# Patient Record
Sex: Male | Born: 1990 | Race: White | Hispanic: No | Marital: Married | Smoking: Never smoker
Health system: Southern US, Community
[De-identification: ages and names within clinical notes are randomized; demographics above are authoritative.]

## PROBLEM LIST (undated history)

## (undated) DIAGNOSIS — Z8709 Personal history of other diseases of the respiratory system: Secondary | ICD-10-CM

## (undated) HISTORY — PX: TONSILLECTOMY: SUR1361

## (undated) HISTORY — DX: Personal history of other diseases of the respiratory system: Z87.09

---

## 2018-03-23 ENCOUNTER — Other Ambulatory Visit: Payer: Self-pay

## 2018-03-23 ENCOUNTER — Emergency Department (HOSPITAL_COMMUNITY)
Admission: EM | Admit: 2018-03-23 | Discharge: 2018-03-24 | Disposition: A | Discharge status: Home / Self Care | Payer: 59 | Attending: Emergency Medicine | Admitting: Emergency Medicine

## 2018-03-23 ENCOUNTER — Encounter (HOSPITAL_COMMUNITY): Payer: Self-pay | Admitting: Emergency Medicine

## 2018-03-23 DIAGNOSIS — R1011 Right upper quadrant pain: Secondary | ICD-10-CM | POA: Insufficient documentation

## 2018-03-23 LAB — CBC
HCT: 45.3 % (ref 39.0–52.0)
HEMOGLOBIN: 15.6 g/dL (ref 13.0–17.0)
MCH: 30.9 pg (ref 26.0–34.0)
MCHC: 34.4 g/dL (ref 30.0–36.0)
MCV: 89.7 fL (ref 78.0–100.0)
Platelets: 264 10*3/uL (ref 150–400)
RBC: 5.05 MIL/uL (ref 4.22–5.81)
RDW: 13.1 % (ref 11.5–15.5)
WBC: 9.2 10*3/uL (ref 4.0–10.5)

## 2018-03-23 NOTE — ED Triage Notes (Signed)
Patient presents with LUQ abdominal pain x1 week. Patient endorses nausea and dry heaves. BMs normal. Nothing makes the pain better or worse.

## 2018-03-24 ENCOUNTER — Emergency Department (HOSPITAL_COMMUNITY): Payer: 59

## 2018-03-24 LAB — COMPREHENSIVE METABOLIC PANEL
ALBUMIN: 4.1 g/dL (ref 3.5–5.0)
ALT: 54 U/L — ABNORMAL HIGH (ref 0–44)
AST: 40 U/L (ref 15–41)
Alkaline Phosphatase: 48 U/L (ref 38–126)
Anion gap: 9 (ref 5–15)
BUN: 13 mg/dL (ref 6–20)
CHLORIDE: 107 mmol/L (ref 98–111)
CO2: 24 mmol/L (ref 22–32)
Calcium: 9.1 mg/dL (ref 8.9–10.3)
Creatinine, Ser: 0.92 mg/dL (ref 0.61–1.24)
GFR calc Af Amer: >60 mL/min (ref >60)
Glucose, Bld: 97 mg/dL (ref 70–99)
POTASSIUM: 4.6 mmol/L (ref 3.5–5.1)
Sodium: 140 mmol/L (ref 135–145)
Total Bilirubin: 1.1 mg/dL (ref 0.3–1.2)
Total Protein: 7.3 g/dL (ref 6.5–8.1)

## 2018-03-24 LAB — LIPASE, BLOOD: LIPASE: 30 U/L (ref 11–51)

## 2018-03-24 MED ORDER — OMEPRAZOLE 20 MG PO CPDR: 20.0000 mg | DELAYED_RELEASE_CAPSULE | Freq: Every day | ORAL | 0 refills | Status: DC

## 2018-03-24 MED ORDER — IOPAMIDOL (ISOVUE-300) INJECTION 61%
INTRAVENOUS | Status: AC
  Filled 2018-03-24: qty 100

## 2018-03-24 MED ORDER — ONDANSETRON HCL 4 MG/2ML IJ SOLN
4.0000 mg | Freq: Once | INTRAMUSCULAR | Status: AC
Start: 2018-03-24 — End: 2018-03-24
  Administered 2018-03-24: 4 mg via INTRAVENOUS
  Filled 2018-03-24: qty 2

## 2018-03-24 MED ORDER — IOPAMIDOL (ISOVUE-300) INJECTION 61%
100.0000 mL | Freq: Once | INTRAVENOUS | Status: AC | PRN
  Administered 2018-03-24: 100 mL via INTRAVENOUS

## 2018-03-24 MED ORDER — MORPHINE SULFATE (PF) 4 MG/ML IV SOLN
4.0000 mg | Freq: Once | INTRAVENOUS | Status: AC
  Administered 2018-03-24: 4 mg via INTRAVENOUS
  Filled 2018-03-24: qty 1

## 2018-03-24 MED ORDER — SUCRALFATE 1 G PO TABS: 1.0000 g | ORAL_TABLET | Freq: Three times a day (TID) | ORAL | 0 refills | Status: DC

## 2018-03-24 NOTE — ED Provider Notes (Signed)
[facility]-EMERGENCY DEPT Provider Note   CSN: [id number] Arrival date & time: 03/23/18  2001     History   Chief Complaint Chief Complaint  Patient presents with  . Abdominal Pain    HPI Derek Fernandez is a 27 y.o. male.  Patient presents to the emergency department with a chief complaint of right upper quadrant pain.  He states that he has been having the symptoms for the past week or so.  He reports associated postprandial pain.  He reports some vomiting and some diarrhea.  He denies any fevers or chills.  He states that he occasionally drinks alcohol, but not regularly.  He has tried taking Tylenol and ibuprofen with no relief.  He states that his pain comes in waves.  He denies any other associated symptoms.  The history is provided by the patient. No language interpreter was used.    History reviewed. No pertinent past medical history.  There are no active problems to display for this patient.   History reviewed. No pertinent surgical history.      Home Medications    Prior to Admission medications   Medication Sig Start Date End Date Taking? Authorizing Provider  acetaminophen (TYLENOL) 500 MG tablet Take 500 mg by mouth every 6 (six) hours as needed for moderate pain.   Yes [provider]    Family History No family history on file.  Social History Social History   Tobacco Use  . Smoking status: Never Smoker  . Smokeless tobacco: Current User    Types: Snuff  Substance Use Topics  . Alcohol use: Yes    Comment: Occassional  . Drug use: Never     Allergies   Patient has no known allergies.   Review of Systems Review of Systems  All other systems reviewed and are negative.    Physical Exam Updated Vital Signs BP (!) 128/98 (BP Location: Right Arm)   Pulse 83   Temp 98.4 F (36.9 C) (Oral)   Resp 20   SpO2 99%   Physical Exam  Constitutional: He is oriented to person, place, and time. He appears  well-developed and well-nourished.  HENT:  Head: Normocephalic and atraumatic.  Eyes: Pupils are equal, round, and reactive to light. Conjunctivae and EOM are normal. Right eye exhibits no discharge. Left eye exhibits no discharge. No scleral icterus.  Neck: Normal range of motion. Neck supple. No JVD present.  Cardiovascular: Normal rate, regular rhythm and normal heart sounds. Exam reveals no gallop and no friction rub.  No murmur heard. Pulmonary/Chest: Effort normal and breath sounds normal. No respiratory distress. He has no wheezes. He has no rales. He exhibits no tenderness.  Abdominal: Soft. He exhibits no distension and no mass. There is tenderness in the right upper quadrant. There is no rebound and no guarding.  Musculoskeletal: Normal range of motion. He exhibits no edema or tenderness.  Neurological: He is alert and oriented to person, place, and time.  Skin: Skin is warm and dry.  Psychiatric: He has a normal mood and affect. His behavior is normal. Judgment and thought content normal.  Nursing note and vitals reviewed.    ED Treatments / Results  Labs (all labs ordered are listed, but only abnormal results are displayed) Labs Reviewed  CBC  LIPASE, BLOOD  COMPREHENSIVE METABOLIC PANEL    EKG None  Radiology No results found.  Procedures Procedures (including critical care time)  Medications Ordered in ED Medications - No data to display  Initial Impression / Assessment and Plan / ED Course  I have reviewed the triage vital signs and the nursing notes.  Pertinent labs & imaging results that were available during my care of the patient were reviewed by me and considered in my medical decision making (see chart for details).     Patient with right upper quadrant pain x1 week.  Some tenderness on exam.  Vital signs are stable.  Patient is afebrile.  There is concern for gallbladder disease, will check right upper quadrant ultrasound.  1:35 AM Ultrasound is  negative, however on repeat exam, the patient is no longer complaining of right upper quadrant pain, but states that the pain is now shifted to his right lower quadrant.  CT shows no acute process.  In the setting of normal imaging, reassuring labs, and reassuring vitals, will peptic ulcer with Carafate and omeprazole.  Recommend GI follow-up if no improvement in the next week or so.  Patient understands and agrees with the plan.  He is stable and ready for discharge.  Final Clinical Impressions(s) / ED Diagnoses   Final diagnoses:  Right upper quadrant abdominal pain    ED Discharge Orders         Ordered    sucralfate (CARAFATE) 1 g tablet  3 times daily with meals & bedtime     03/24/18 0317    omeprazole (PRILOSEC) 20 MG capsule  Daily     03/24/18 0317           Roxy Horseman, PA-C 03/24/18 0318    Nira Conn, MD 03/24/18 1739

## 2019-02-06 ENCOUNTER — Ambulatory Visit (HOSPITAL_COMMUNITY)
Admission: EM | Admit: 2019-02-06 | Discharge: 2019-02-06 | Disposition: A | Discharge status: Home / Self Care | Payer: 59 | Attending: Family Medicine | Admitting: Family Medicine

## 2019-02-06 ENCOUNTER — Encounter (HOSPITAL_COMMUNITY): Payer: Self-pay

## 2019-02-06 ENCOUNTER — Other Ambulatory Visit: Payer: Self-pay

## 2019-02-06 DIAGNOSIS — R51 Headache: Secondary | ICD-10-CM

## 2019-02-06 DIAGNOSIS — R202 Paresthesia of skin: Secondary | ICD-10-CM | POA: Diagnosis not present

## 2019-02-06 DIAGNOSIS — R232 Flushing: Secondary | ICD-10-CM | POA: Diagnosis not present

## 2019-02-06 DIAGNOSIS — R519 Headache, unspecified: Secondary | ICD-10-CM

## 2019-02-06 NOTE — Discharge Instructions (Signed)
Your symptoms today have improved or resolved.  If your symptoms return, go to the ER.  Establish primary care doctor and be seen for follow up in 1-2 weeks.

## 2019-02-06 NOTE — ED Triage Notes (Signed)
Pt states he was at work ant started to feel some chest pain and pain his left arm. Pt states he still has a headache. Pt states a burning type of pain.

## 2019-02-06 NOTE — ED Provider Notes (Signed)
[facility]    CSN: [id number] Arrival date & time: 02/06/19  1209     History   Chief Complaint Chief Complaint  Patient presents with  . Chest Pain    HPI Derek Fernandez is a 28 y.o. male. He reports an episode this morning of numbness and flushing in his face which progressed to his chest and left arm. This episode lasted a few minutes and he reports he has a mild residual headache at this time. He took 650 mg aspirin which he felt improved his symptoms. He denies other pain, SOB, nausea, vomiting, weakness, diaphoresis with this episode or currently. He denies family hx of cardiac disease. He denies use of illicit drugs. He states he has had previous episodes of this type one year ago and several years ago. He does not have a primary care provider.   The history is provided by the patient.    History reviewed. No pertinent past medical history.  There are no active problems to display for this patient.   History reviewed. No pertinent surgical history.     Home Medications    Prior to Admission medications   Medication Sig Start Date End Date Taking? Authorizing Provider  acetaminophen (TYLENOL) 500 MG tablet Take 500 mg by mouth every 6 (six) hours as needed for moderate pain.    [provider]  omeprazole (PRILOSEC) 20 MG capsule Take 1 capsule (20 mg total) by mouth daily. 03/24/18   Montine Circle, PA-C  sucralfate (CARAFATE) 1 g tablet Take 1 tablet (1 g total) by mouth 4 (four) times daily -  with meals and at bedtime. 03/24/18   Montine Circle, PA-C    Family History History reviewed. No pertinent family history.  Social History Social History   Tobacco Use  . Smoking status: Never Smoker  . Smokeless tobacco: Current User    Types: Snuff  Substance Use Topics  . Alcohol use: Yes    Comment: Occassional  . Drug use: Never     Allergies   Patient has no known allergies.   Review of Systems Review of Systems   Constitutional: Negative for chills, fatigue and fever.  HENT: Negative for congestion, ear pain, sinus pressure, sore throat, tinnitus and trouble swallowing.   Eyes: Negative for pain and visual disturbance.  Respiratory: Negative for cough and shortness of breath.   Cardiovascular: Negative for chest pain and palpitations.  Gastrointestinal: Negative for abdominal pain and vomiting.  Genitourinary: Negative for dysuria and hematuria.  Musculoskeletal: Negative for arthralgias and back pain.  Skin: Negative for color change and rash.  Neurological: Negative for seizures and syncope.  All other systems reviewed and are negative.    Physical Exam Triage Vital Signs ED Triage Vitals  Enc Vitals Group     BP 02/06/19 1224 134/81     Pulse Rate 02/06/19 1224 78     Resp 02/06/19 1224 18     Temp 02/06/19 1224 98.7 F (37.1 C)     Temp Source 02/06/19 1224 Oral     SpO2 02/06/19 1224 100 %     Weight 02/06/19 1226 295 lb (133.8 kg)     Height --      Head Circumference --      Peak Flow --      Pain Score 02/06/19 1225 4     Pain Loc --      Pain Edu? --      Excl. in [place]? --  No data found.  Updated Vital Signs BP 134/81 (BP Location: Right Arm)   Pulse 78   Temp 98.7 F (37.1 C) (Oral)   Resp 18   Wt 295 lb (133.8 kg)   SpO2 100%   Visual Acuity Right Eye Distance:   Left Eye Distance:   Bilateral Distance:    Right Eye Near:   Left Eye Near:    Bilateral Near:     Physical Exam Vitals signs and nursing note reviewed.  Constitutional:      Appearance: He is well-developed.  HENT:     Head: Normocephalic and atraumatic.  Eyes:     Conjunctiva/sclera: Conjunctivae normal.  Neck:     Musculoskeletal: Neck supple.  Cardiovascular:     Rate and Rhythm: Normal rate and regular rhythm.     Heart sounds: No murmur.  Pulmonary:     Effort: Pulmonary effort is normal. No respiratory distress.     Breath sounds: Normal breath sounds.  Abdominal:      Palpations: Abdomen is soft.     Tenderness: There is no abdominal tenderness.  Skin:    General: Skin is warm and dry.  Neurological:     Mental Status: He is alert and oriented to person, place, and time.     Cranial Nerves: No cranial nerve deficit or facial asymmetry.     Sensory: No sensory deficit.     Motor: No weakness.     Gait: Gait normal.     Deep Tendon Reflexes: Reflexes normal.      UC Treatments / Results  Labs (all labs ordered are listed, but only abnormal results are displayed) Labs Reviewed - No data to display  EKG None  Radiology No results found.  Procedures Procedures (including critical care time)  Medications Ordered in UC Medications - No data to display  Initial Impression / Assessment and Plan / UC Course  I have reviewed the triage vital signs and the nursing notes.  Pertinent labs & imaging results that were available during my care of the patient were reviewed by me and considered in my medical decision making (see chart for details).   Headache, nonintractable. EKG performed; normal sinus rhythm without ST elevation, rate 78. Neuro exam normal. Symptoms have improved or resolved at this time.  Discussed with patient that he is be seen in the ED if his symptoms return.  Also discussed that he needs to establish a primary care provider.  Case discussed with Dr. Tracie HarrierHagler and he agrees with treatment plan.   Final Clinical Impressions(s) / UC Diagnoses   Final diagnoses:  Acute nonintractable headache, unspecified headache type     Discharge Instructions     Your symptoms today have improved or resolved.  If your symptoms return, go to the ER.  Establish primary care doctor and be seen for follow up in 1-2 weeks.      ED Prescriptions    None     Controlled Substance Prescriptions  Controlled Substance Registry consulted? Not Applicable   Mickie Bailate, Noah Lembke H, NP 02/06/19 1420

## 2019-02-07 ENCOUNTER — Other Ambulatory Visit: Payer: Self-pay

## 2019-02-07 ENCOUNTER — Emergency Department (HOSPITAL_COMMUNITY)
Admission: EM | Admit: 2019-02-07 | Discharge: 2019-02-08 | Disposition: A | Discharge status: Home / Self Care | Payer: 59 | Attending: Emergency Medicine | Admitting: Emergency Medicine

## 2019-02-07 ENCOUNTER — Encounter (HOSPITAL_COMMUNITY): Payer: Self-pay | Admitting: Emergency Medicine

## 2019-02-07 DIAGNOSIS — R51 Headache: Secondary | ICD-10-CM | POA: Diagnosis not present

## 2019-02-07 DIAGNOSIS — R040 Epistaxis: Secondary | ICD-10-CM | POA: Diagnosis present

## 2019-02-07 DIAGNOSIS — F17228 Nicotine dependence, chewing tobacco, with other nicotine-induced disorders: Secondary | ICD-10-CM | POA: Diagnosis not present

## 2019-02-07 DIAGNOSIS — R0981 Nasal congestion: Secondary | ICD-10-CM | POA: Insufficient documentation

## 2019-02-07 DIAGNOSIS — R0789 Other chest pain: Secondary | ICD-10-CM

## 2019-02-07 NOTE — ED Triage Notes (Signed)
Pt c/o nose bleed that lasted for an hour. Pt also has headache. Bleeding stopped at this time.

## 2019-02-08 ENCOUNTER — Emergency Department (HOSPITAL_COMMUNITY): Payer: 59

## 2019-02-08 LAB — BASIC METABOLIC PANEL
Anion gap: 10 (ref 5–15)
BUN: 14 mg/dL (ref 6–20)
CO2: 24 mmol/L (ref 22–32)
Calcium: 8.7 mg/dL — ABNORMAL LOW (ref 8.9–10.3)
Chloride: 107 mmol/L (ref 98–111)
Creatinine, Ser: 0.85 mg/dL (ref 0.61–1.24)
GFR calc Af Amer: >60 mL/min (ref >60)
GFR calc non Af Amer: >60 mL/min (ref >60)
Glucose, Bld: 110 mg/dL — ABNORMAL HIGH (ref 70–99)
Potassium: 3.6 mmol/L (ref 3.5–5.1)
Sodium: 141 mmol/L (ref 135–145)

## 2019-02-08 LAB — CBC WITH DIFFERENTIAL/PLATELET
Abs Immature Granulocytes: 0.05 10*3/uL (ref 0.00–0.07)
Basophils Absolute: 0.1 10*3/uL (ref 0.0–0.1)
Basophils Relative: 1 %
Eosinophils Absolute: 0.2 10*3/uL (ref 0.0–0.5)
Eosinophils Relative: 2 %
HCT: 42.4 % (ref 39.0–52.0)
Hemoglobin: 14 g/dL (ref 13.0–17.0)
Immature Granulocytes: 1 %
Lymphocytes Relative: 32 %
Lymphs Abs: 3 10*3/uL (ref 0.7–4.0)
MCH: 30.5 pg (ref 26.0–34.0)
MCHC: 33 g/dL (ref 30.0–36.0)
MCV: 92.4 fL (ref 80.0–100.0)
Monocytes Absolute: 1 10*3/uL (ref 0.1–1.0)
Monocytes Relative: 11 %
Neutro Abs: 5.2 10*3/uL (ref 1.7–7.7)
Neutrophils Relative %: 53 %
Platelets: 250 10*3/uL (ref 150–400)
RBC: 4.59 MIL/uL (ref 4.22–5.81)
RDW: 13.2 % (ref 11.5–15.5)
WBC: 9.6 10*3/uL (ref 4.0–10.5)
nRBC: 0 % (ref 0.0–0.2)

## 2019-02-08 LAB — TROPONIN I (HIGH SENSITIVITY)
Troponin I (High Sensitivity): 5 ng/L (ref <18)
Troponin I (High Sensitivity): 5 ng/L (ref <18)

## 2019-02-08 MED ORDER — ALUM & MAG HYDROXIDE-SIMETH 200-200-20 MG/5ML PO SUSP
30.0000 mL | Freq: Once | ORAL | Status: AC
  Administered 2019-02-08: 30 mL via ORAL
  Filled 2019-02-08: qty 30

## 2019-02-08 MED ORDER — DOXYCYCLINE HYCLATE 100 MG PO CAPS: 100.0000 mg | ORAL_CAPSULE | Freq: Two times a day (BID) | ORAL | 0 refills | Status: DC

## 2019-02-08 MED ORDER — OXYMETAZOLINE HCL 0.05 % NA SOLN
1.0000 | Freq: Once | NASAL | Status: AC
  Administered 2019-02-08: 1 via NASAL
  Filled 2019-02-08: qty 30

## 2019-02-08 NOTE — ED Provider Notes (Signed)
[facility] EMERGENCY DEPARTMENT Provider Note   CSN: [id number] Arrival date & time: 02/07/19  2032     History   Chief Complaint Chief Complaint  Patient presents with  . Epistaxis    HPI Ernest Pinendrew J Dockham is a 28 y.o. male.     Patient with no medical history.  Here with nosebleed from his left nare that resolved after about 1 hour at home.  States he has had intermittent bloody congestion for the past 1 week.  He denies cough or fever.  He was seen in urgent care 2 days ago for pain in his chest that was brief while he was driving.  It lasted for about 10 minutes, down his left arm.  He was told his EKG was normal.  He has not had any further chest pain.  He denies any shortness of breath, nausea, vomiting, diaphoresis.  He states his nosebleed stopped on its own.  He does not smoke.  He complains of pain in his face as well as a headache.  He feels that he could have a sinus infection.  Denies any cardiac history.  He does not smoke cigarettes.  No history of asthma or COPD.  No dizziness or lightheadedness.  The history is provided by the patient.  Epistaxis Associated symptoms: congestion   Associated symptoms: no dizziness, no fever and no headaches     History reviewed. No pertinent past medical history.  There are no active problems to display for this patient.   History reviewed. No pertinent surgical history.      Home Medications    Prior to Admission medications   Medication Sig Start Date End Date Taking? Authorizing Provider  acetaminophen (TYLENOL) 500 MG tablet Take 500 mg by mouth every 6 (six) hours as needed for moderate pain.    [provider]  omeprazole (PRILOSEC) 20 MG capsule Take 1 capsule (20 mg total) by mouth daily. 03/24/18   Roxy HorsemanBrowning, Robert, PA-C  sucralfate (CARAFATE) 1 g tablet Take 1 tablet (1 g total) by mouth 4 (four) times daily -  with meals and at bedtime. 03/24/18   Roxy HorsemanBrowning, Robert, PA-C    Family History History  reviewed. No pertinent family history.  Social History Social History   Tobacco Use  . Smoking status: Never Smoker  . Smokeless tobacco: Current User    Types: Snuff  Substance Use Topics  . Alcohol use: Yes    Comment: Occassional  . Drug use: Never     Allergies   Patient has no known allergies.   Review of Systems Review of Systems  Constitutional: Negative for activity change, appetite change and fever.  HENT: Positive for congestion, nosebleeds and sinus pressure. Negative for trouble swallowing.   Eyes: Negative for visual disturbance.  Respiratory: Positive for chest tightness. Negative for shortness of breath.   Cardiovascular: Positive for chest pain.  Gastrointestinal: Negative for abdominal pain, nausea and vomiting.  Genitourinary: Negative for dysuria and hematuria.  Musculoskeletal: Negative for arthralgias and myalgias.  Skin: Negative for rash.  Neurological: Negative for dizziness, weakness and headaches.    all other systems are negative except as noted in the HPI and PMH.    Physical Exam Updated Vital Signs BP (!) 151/106 (BP Location: Right Arm)   Pulse 91   Temp 98.3 F (36.8 C) (Oral)   Resp 19   Ht 6' (1.829 m)   Wt 131.5 kg   SpO2 100%   BMI 39.33 kg/m   Physical Exam  Vitals signs and nursing note reviewed.  Constitutional:      General: He is not in acute distress.    Appearance: He is well-developed. He is obese.  HENT:     Head: Normocephalic and atraumatic.     Right Ear: Tympanic membrane normal.     Left Ear: Tympanic membrane normal.     Nose: Congestion present.     Comments: No active bleeding.  There is abrasions to the septum bilaterally, no septal hematoma or hemotympanum.  Oropharynx is clear.  Bilateral frontal and maxillary sinus tenderness.    Mouth/Throat:     Pharynx: No oropharyngeal exudate.  Eyes:     Conjunctiva/sclera: Conjunctivae normal.     Pupils: Pupils are equal, round, and reactive to light.   Neck:     Musculoskeletal: Normal range of motion and neck supple.     Comments: No meningismus. Cardiovascular:     Rate and Rhythm: Normal rate and regular rhythm.     Heart sounds: Normal heart sounds. No murmur.  Pulmonary:     Effort: Pulmonary effort is normal. No respiratory distress.     Breath sounds: Normal breath sounds.  Abdominal:     Palpations: Abdomen is soft.     Tenderness: There is no abdominal tenderness. There is no guarding or rebound.  Musculoskeletal: Normal range of motion.        General: No tenderness.  Skin:    General: Skin is warm.     Capillary Refill: Capillary refill takes less than 2 seconds.  Neurological:     General: No focal deficit present.     Mental Status: He is alert and oriented to person, place, and time. Mental status is at baseline.     Cranial Nerves: No cranial nerve deficit.     Motor: No abnormal muscle tone.     Coordination: Coordination normal.     Comments:  5/5 strength throughout. CN 2-12 intact.Equal grip strength.   Psychiatric:        Behavior: Behavior normal.      ED Treatments / Results  Labs (all labs ordered are listed, but only abnormal results are displayed) Labs Reviewed  BASIC METABOLIC PANEL - Abnormal; Notable for the following components:      Result Value   Glucose, Bld 110 (*)    Calcium 8.7 (*)    All other components within normal limits  CBC WITH DIFFERENTIAL/PLATELET  TROPONIN I (HIGH SENSITIVITY)  TROPONIN I (HIGH SENSITIVITY)    EKG EKG Interpretation  Date/Time:  Friday February 08 2019 00:52:12 EDT Ventricular Rate:  73 PR Interval:    QRS Duration: 92 QT Interval:  358 QTC Calculation: 395 R Axis:   67 Text Interpretation:  Sinus rhythm Abnormal inferior Q waves Baseline wander in lead(s) II V2 V6 No significant change was found Confirmed by Glynn Octaveancour, Chasady Longwell [phone]([id number]) on 02/08/2019 1:06:08 AM   Radiology Dg Chest 2 View  Result Date: 02/08/2019 CLINICAL DATA:  Chest pain EXAM:  CHEST - 2 VIEW COMPARISON:  None. FINDINGS: The heart size and mediastinal contours are within normal limits. Both lungs are clear. The visualized skeletal structures are unremarkable. IMPRESSION: No active cardiopulmonary disease. Electronically Signed   By: Katherine Mantlehristopher  Green M.D.   On: 02/08/2019 01:33    Procedures Procedures (including critical care time)  Medications Ordered in ED Medications  oxymetazoline (AFRIN) 0.05 % nasal spray 1 spray (has no administration in time range)     Initial Impression / Assessment and Plan /  ED Course  I have reviewed the triage vital signs and the nursing notes.  Pertinent labs & imaging results that were available during my care of the patient were reviewed by me and considered in my medical decision making (see chart for details).       Nosebleed in setting of nasal congestion for the past 1 week.  Episode of chest pain yesterday which has since resolved.  EKG is unchanged.  Chest x-ray is negative.  Troponin negative.  Low suspicion for ACS or PE.  Patient treated with topical Afrin given the friability of his nasal mucosa.  We will treat for sinusitis with antibiotics. Low suspicion for ACS, PE, or dissection.  Troponin negative x2. No further episodes of chest pain.  D/w patient humidified air, topical afrin, PCP and ENT followup.  Return precautions discussed.  Final Clinical Impressions(s) / ED Diagnoses   Final diagnoses:  Epistaxis, recurrent  Atypical chest pain    ED Discharge Orders    None       Aldon Hengst, Annie Main, MD 02/08/19 [phone]

## 2019-02-08 NOTE — Discharge Instructions (Signed)
Use the Afrin spray twice daily for the next 2 days only then stop.  Take the antibiotics as prescribed for a possible sinus infection.  Follow-up with your primary doctor and the ENT doctor.  Return to the ED with new or worsening symptoms.

## 2019-02-19 ENCOUNTER — Telehealth (INDEPENDENT_AMBULATORY_CARE_PROVIDER_SITE_OTHER): Payer: 59 | Admitting: Family Medicine

## 2019-02-19 ENCOUNTER — Encounter: Payer: Self-pay | Admitting: Family Medicine

## 2019-02-19 DIAGNOSIS — R0789 Other chest pain: Secondary | ICD-10-CM

## 2019-02-19 DIAGNOSIS — R040 Epistaxis: Secondary | ICD-10-CM

## 2019-02-24 NOTE — Progress Notes (Signed)
Virtual Visit via Video Note  I connected with Derek Fernandez on 02/24/19 at  2:50 PM EDT by a video enabled telemedicine application and verified that I am speaking with the correct person using two identifiers.  Location: Patient: Located at home during today's encounter  Provider: Located at primary care office     I discussed the limitations of evaluation and management by telemedicine and the availability of in person appointments. The patient expressed understanding and agreed to proceed.  History of Present Illness: Derek Fernandez is present for today's video encounter to establish care.  Patient was recently seen at the ER with a complaint of chest pain and epistaxis. He reports chest pain resolved after ER visit, however his nose continued to bleed for another 2 to 3 days.  He attempted relief with Afrin but reports that made worse.  He has no known history of seasonal allergies.  Endorses however having some congestion and dry nasal passages.  He presently does not take any medication.  Nose discontinued bleeding on its own.  He has never been evaluated by an ENT.  This was the first time he is ever experienced any issues with nosebleeds. He is obese and inactive of routine physical activity. No known family history of heart disease. He is a non-smoker. Uncertain of last physical exam.  Assessment and Plan: 1. Epistaxis -Resolved _Requesting an ENT referral as this was new occurrence and took a few days to resolve. Suspect allergies and recommend OTC antihistamines  2. Atypical chest pain, resolved Uncertain of the etiology although suspect possible anxiety related to persistency of nosebleeding. GAD 7 negative   Follow Up Instructions: Schedule CPE after early convenience with fasting labs.   I discussed the assessment and treatment plan with the patient. The patient was provided an opportunity to ask questions and all were answered. The patient agreed with the plan and demonstrated  an understanding of the instructions.   The patient was advised to call back or seek an in-person evaluation if the symptoms worsen or if the condition fails to improve as anticipated.  I provided 25 minutes of non-face-to-face time during this encounter.   Molli Barrows, FNP

## 2019-03-13 ENCOUNTER — Telehealth: Payer: Self-pay

## 2019-03-13 NOTE — Telephone Encounter (Signed)
Called patient to do their pre-visit COVID screening.  Have you been tested for COVID or are you currently waiting for COVID test results? no  Have you recently traveled internationally([place], [place], [place], [place], [place]) or within the [place] to a hotspot area([place], [place], [place], [place], [place])? no  Are you currently experiencing any of the following: fever, cough, SHOB, fatigue, body aches, loss of smell, rash, diarrhea, vomiting, severe headaches, weakness, sore throat? no  Have you been in contact with anyone who has recently travelled? no  Have you been in contact with anyone who is experiencing any of the above symptoms or been diagnosed with [place]  or works in or has recently visited a SNF? No  Asked patient to come fasting for CPE labs.

## 2019-03-14 ENCOUNTER — Other Ambulatory Visit: Payer: Self-pay

## 2019-03-14 ENCOUNTER — Encounter: Payer: Self-pay | Admitting: Family Medicine

## 2019-03-14 ENCOUNTER — Ambulatory Visit (INDEPENDENT_AMBULATORY_CARE_PROVIDER_SITE_OTHER): Payer: 59 | Admitting: Family Medicine

## 2019-03-14 VITALS — BP 134/79 | HR 70 | Temp 97.3°F | Resp 17 | Ht 70.0 in | Wt 321.0 lb

## 2019-03-14 DIAGNOSIS — Z6841 Body Mass Index (BMI) 40.0 and over, adult: Secondary | ICD-10-CM

## 2019-03-14 DIAGNOSIS — Z0001 Encounter for general adult medical examination with abnormal findings: Secondary | ICD-10-CM | POA: Diagnosis not present

## 2019-03-14 DIAGNOSIS — Z1159 Encounter for screening for other viral diseases: Secondary | ICD-10-CM

## 2019-03-14 DIAGNOSIS — Z1389 Encounter for screening for other disorder: Secondary | ICD-10-CM | POA: Diagnosis not present

## 2019-03-14 DIAGNOSIS — Z0000 Encounter for general adult medical examination without abnormal findings: Secondary | ICD-10-CM

## 2019-03-14 DIAGNOSIS — Z13228 Encounter for screening for other metabolic disorders: Secondary | ICD-10-CM

## 2019-03-14 LAB — POCT URINALYSIS DIP (CLINITEK)
Bilirubin, UA: NEGATIVE
Blood, UA: NEGATIVE
Glucose, UA: NEGATIVE mg/dL
Ketones, POC UA: NEGATIVE mg/dL
Leukocytes, UA: NEGATIVE
Nitrite, UA: NEGATIVE
POC PROTEIN,UA: NEGATIVE
Spec Grav, UA: >=1.03 — AB (ref 1.010–1.025)
Urobilinogen, UA: 0.2 E.U./dL
pH, UA: 6 (ref 5.0–8.0)

## 2019-03-14 NOTE — Progress Notes (Addendum)
 Subjective:  Patient ID: Derek Fernandez, male    DOB: 12/22/1990  Age: 28 y.o. MRN: [id number]  CC: Annual Exam   HPI Derek Fernandez is a 28-year-old obese male who presents today for a complete physical exam.  He would like to have "his pancreas checked" as his mother's pancreas "recently shut down" which he describes as frequent diarrhea and having to be on medications.  He is unsure of the diagnosis. He does not exercise regularly and is not on a healthy diet.  Past Medical History:  Diagnosis Date  . Personal history of asthma     Past Surgical History:  Procedure Laterality Date  . TONSILLECTOMY      Family History  Problem Relation Age of Onset  . Migraines Father        severe & give stroke like symptoms  . Diabetes Paternal Grandmother   . Heart disease Paternal Grandmother   . Diabetes Paternal Grandfather   . Heart disease Paternal Grandfather     No Known Allergies  No outpatient medications prior to visit.   No facility-administered medications prior to visit.      ROS Review of Systems  Constitutional: Negative for activity change and appetite change.  HENT: Negative for sinus pressure and sore throat.   Eyes: Negative for visual disturbance.  Respiratory: Negative for cough, chest tightness and shortness of breath.   Cardiovascular: Negative for chest pain and leg swelling.  Gastrointestinal: Negative for abdominal distention, abdominal pain, constipation and diarrhea.  Endocrine: Negative.   Genitourinary: Negative for dysuria.  Musculoskeletal: Negative for joint swelling and myalgias.  Skin: Negative for rash.  Allergic/Immunologic: Negative.   Neurological: Negative for weakness, light-headedness and numbness.  Psychiatric/Behavioral: Negative for dysphoric mood and suicidal ideas.    Objective:  BP 134/79   Pulse 70   Temp (!) 97.3 F (36.3 C) (Temporal)   Resp 17   Ht 5' 10" (1.778 m)   Wt (!) 321 lb (145.6 kg)   SpO2 96%    BMI 46.06 kg/m   BP/Weight 03/14/2019 02/08/2019 02/07/2019  Systolic BP 134 138 -  Diastolic BP 79 100 -  Wt. (Lbs) 321 - 290  BMI 46.06 - 39.33      Physical Exam Constitutional:      Appearance: He is well-developed.     Comments: Morbidly obese  HENT:     Head: Normocephalic and atraumatic.     Right Ear: External ear normal.     Left Ear: External ear normal.  Eyes:     Conjunctiva/sclera: Conjunctivae normal.     Pupils: Pupils are equal, round, and reactive to light.  Neck:     Musculoskeletal: Normal range of motion and neck supple.     Trachea: No tracheal deviation.  Cardiovascular:     Rate and Rhythm: Normal rate and regular rhythm.     Heart sounds: Normal heart sounds. No murmur.  Pulmonary:     Effort: Pulmonary effort is normal. No respiratory distress.     Breath sounds: Normal breath sounds. No wheezing.  Chest:     Chest wall: No tenderness.  Abdominal:     General: Bowel sounds are normal.     Palpations: Abdomen is soft. There is no mass.     Tenderness: There is no abdominal tenderness.  Genitourinary:    Penis: Normal.      Scrotum/Testes: Normal.     Comments: No hernia Musculoskeletal: Normal range of motion.          General: No tenderness.  Skin:    General: Skin is warm and dry.  Neurological:     Mental Status: He is alert and oriented to person, place, and time.     CMP Latest Ref Rng & Units 02/08/2019 03/23/2018  Glucose 70 - 99 mg/dL 110(H) 97  BUN 6 - 20 mg/dL 14 13  Creatinine 0.61 - 1.24 mg/dL 0.85 0.92  Sodium 135 - 145 mmol/L 141 140  Potassium 3.5 - 5.1 mmol/L 3.6 4.6  Chloride 98 - 111 mmol/L 107 107  CO2 22 - 32 mmol/L 24 24  Calcium 8.9 - 10.3 mg/dL 8.7(L) 9.1  Total Protein 6.5 - 8.1 g/dL - 7.3  Total Bilirubin 0.3 - 1.2 mg/dL - 1.1  Alkaline Phos 38 - 126 U/L - 48  AST 15 - 41 U/L - 40  ALT 0 - 44 U/L - 54(H)    Lipid Panel  No results found for: CHOL, TRIG, HDL, CHOLHDL, VLDL, LDLCALC, LDLDIRECT  CBC     Component Value Date/Time   WBC 9.6 02/08/2019 0058   RBC 4.59 02/08/2019 0058   HGB 14.0 02/08/2019 0058   HCT 42.4 02/08/2019 0058   PLT 250 02/08/2019 0058   MCV 92.4 02/08/2019 0058   MCH 30.5 02/08/2019 0058   MCHC 33.0 02/08/2019 0058   RDW 13.2 02/08/2019 0058   LYMPHSABS 3.0 02/08/2019 0058   MONOABS 1.0 02/08/2019 0058   EOSABS 0.2 02/08/2019 0058   BASOSABS 0.1 02/08/2019 0058    No results found for: HGBA1C  Assessment & Plan:   1. Annual physical exam Counseled on 150 minutes of exercise per week, healthy eating (including decreased daily intake of saturated fats, cholesterol, added sugars, sodium), STI prevention, routine healthcare maintenance.   2. Screening for blood or protein in urine Urine appears concentrated, advised to increase fluid intake - POCT URINALYSIS DIP (CLINITEK)  3. Screening for viral disease  4. Screening for metabolic disorder We will screen with amylase lipase as per request - CMP14+EGFR - Lipid panel - Hemoglobin A1c - Amylase - Lipase  5. Class 3 severe obesity due to excess calories without serious comorbidity with body mass index (BMI) of 45.0 to 49.9 in adult (HCC) Counseled on avoiding late meals, reducing caloric intake, increasing physical activity which he will commence by a daily walking regimen.    No orders of the defined types were placed in this encounter.   Follow-up: Return in about 6 months (around 09/14/2019) for follow up on weight.       Enobong Newlin, MD, FAAFP. [facility] [place], [place] [phone]   03/14/2019, 9:10 AM 

## 2019-03-14 NOTE — Patient Instructions (Signed)
Preventive Care 19-26 Years Old, Male Preventive care refers to lifestyle choices and visits with your health care provider that can promote health and wellness. This includes:  A yearly physical exam. This is also called an annual well check.  Regular dental and eye exams.  Immunizations.  Screening for certain conditions.  Healthy lifestyle choices, such as eating a healthy diet, getting regular exercise, not using drugs or products that contain nicotine and tobacco, and limiting alcohol use. What can I expect for my preventive care visit? Physical exam Your health care provider will check:  Height and weight. These may be used to calculate body mass index (BMI), which is a measurement that tells if you are at a healthy weight.  Heart rate and blood pressure.  Your skin for abnormal spots. Counseling Your health care provider may ask you questions about:  Alcohol, tobacco, and drug use.  Emotional well-being.  Home and relationship well-being.  Sexual activity.  Eating habits.  Work and work Statistician. What immunizations do I need?  Influenza (flu) vaccine  This is recommended every year. Tetanus, diphtheria, and pertussis (Tdap) vaccine  You may need a Td booster every 10 years. Varicella (chickenpox) vaccine  You may need this vaccine if you have not already been vaccinated. Human papillomavirus (HPV) vaccine  If recommended by your health care provider, you may need three doses over 6 months. Measles, mumps, and rubella (MMR) vaccine  You may need at least one dose of MMR. You may also need a second dose. Meningococcal conjugate (MenACWY) vaccine  One dose is recommended if you are 45-76 years old and a Market researcher living in a residence hall, or if you have one of several medical conditions. You may also need additional booster doses. Pneumococcal conjugate (PCV13) vaccine  You may need this if you have certain conditions and were not  previously vaccinated. Pneumococcal polysaccharide (PPSV23) vaccine  You may need one or two doses if you smoke cigarettes or if you have certain conditions. Hepatitis A vaccine  You may need this if you have certain conditions or if you travel or work in places where you may be exposed to hepatitis A. Hepatitis B vaccine  You may need this if you have certain conditions or if you travel or work in places where you may be exposed to hepatitis B. Haemophilus influenzae type b (Hib) vaccine  You may need this if you have certain risk factors. You may receive vaccines as individual doses or as more than one vaccine together in one shot (combination vaccines). Talk with your health care provider about the risks and benefits of combination vaccines. What tests do I need? Blood tests  Lipid and cholesterol levels. These may be checked every 5 years starting at age 17.  Hepatitis C test.  Hepatitis B test. Screening   Diabetes screening. This is done by checking your blood sugar (glucose) after you have not eaten for a while (fasting).  Sexually transmitted disease (STD) testing. Talk with your health care provider about your test results, treatment options, and if necessary, the need for more tests. Follow these instructions at home: Eating and drinking   Eat a diet that includes fresh fruits and vegetables, whole grains, lean protein, and low-fat dairy products.  Take vitamin and mineral supplements as recommended by your health care provider.  Do not drink alcohol if your health care provider tells you not to drink.  If you drink alcohol: ? Limit how much you have to 0-2  drinks a day. ? Be aware of how much alcohol is in your drink. In the [place] one drink equals one 12 oz bottle of beer (355 mL), one 5 oz glass of wine (148 mL), or one 1 oz glass of hard liquor (44 mL). Lifestyle  Take daily care of your teeth and gums.  Stay active. Exercise for at least 30 minutes on 5 or  more days each week.  Do not use any products that contain nicotine or tobacco, such as cigarettes, e-cigarettes, and chewing tobacco. If you need help quitting, ask your health care provider.  If you are sexually active, practice safe sex. Use a condom or other form of protection to prevent STIs (sexually transmitted infections). What's next?  Go to your health care provider once a year for a well check visit.  Ask your health care provider how often you should have your eyes and teeth checked.  Stay up to date on all vaccines. This information is not intended to replace advice given to you by your health care provider. Make sure you discuss any questions you have with your health care provider. Document Released: 09/27/2001 Document Revised: 07/26/2018 Document Reviewed: 07/26/2018 Elsevier Patient Education  2020 Elsevier Inc.  

## 2019-03-15 LAB — HEMOGLOBIN A1C
Est. average glucose Bld gHb Est-mCnc: 103 mg/dL
Hgb A1c MFr Bld: 5.2 % (ref 4.8–5.6)

## 2019-03-15 LAB — CMP14+EGFR
ALT: 63 IU/L — ABNORMAL HIGH (ref 0–44)
AST: 37 IU/L (ref 0–40)
Albumin/Globulin Ratio: 1.9 (ref 1.2–2.2)
Albumin: 4.7 g/dL (ref 4.1–5.2)
Alkaline Phosphatase: 52 IU/L (ref 39–117)
BUN/Creatinine Ratio: 9 (ref 9–20)
BUN: 10 mg/dL (ref 6–20)
Bilirubin Total: 0.4 mg/dL (ref 0.0–1.2)
CO2: 25 mmol/L (ref 20–29)
Calcium: 9.2 mg/dL (ref 8.7–10.2)
Chloride: 101 mmol/L (ref 96–106)
Creatinine, Ser: 1.13 mg/dL (ref 0.76–1.27)
GFR calc Af Amer: 104 mL/min/{1.73_m2} (ref >59)
GFR calc non Af Amer: 90 mL/min/{1.73_m2} (ref >59)
Globulin, Total: 2.5 g/dL (ref 1.5–4.5)
Glucose: 87 mg/dL (ref 65–99)
Potassium: 4.6 mmol/L (ref 3.5–5.2)
Sodium: 142 mmol/L (ref 134–144)
Total Protein: 7.2 g/dL (ref 6.0–8.5)

## 2019-03-15 LAB — LIPASE: Lipase: 21 U/L (ref 13–78)

## 2019-03-15 LAB — LIPID PANEL
Chol/HDL Ratio: 4.3 ratio (ref 0.0–5.0)
Cholesterol, Total: 185 mg/dL (ref 100–199)
HDL: 43 mg/dL (ref >39)
LDL Calculated: 92 mg/dL (ref 0–99)
Triglycerides: 252 mg/dL — ABNORMAL HIGH (ref 0–149)
VLDL Cholesterol Cal: 50 mg/dL — ABNORMAL HIGH (ref 5–40)

## 2019-03-15 LAB — AMYLASE: Amylase: 34 U/L (ref 31–110)

## 2019-04-15 ENCOUNTER — Other Ambulatory Visit: Payer: Self-pay

## 2019-04-15 ENCOUNTER — Ambulatory Visit
Admission: EM | Admit: 2019-04-15 | Discharge: 2019-04-15 | Disposition: A | Discharge status: Home / Self Care | Payer: 59

## 2019-04-15 DIAGNOSIS — M545 Low back pain, unspecified: Secondary | ICD-10-CM

## 2019-04-15 MED ORDER — METHOCARBAMOL 500 MG PO TABS: 500.0000 mg | ORAL_TABLET | Freq: Two times a day (BID) | ORAL | 0 refills | Status: DC

## 2019-04-15 MED ORDER — MELOXICAM 7.5 MG PO TABS: 7.5000 mg | ORAL_TABLET | Freq: Every day | ORAL | 0 refills | Status: DC

## 2019-04-15 NOTE — ED Triage Notes (Signed)
Pt presents to UC with c/o lower back pain since 3 days after doing yardwork. PT reports taking tylenol without relief. Pt has biofreeze patch on back at this time.

## 2019-04-15 NOTE — ED Provider Notes (Signed)
[facility]    CSN: [id number] Arrival date & time: 04/15/19  4696      History   Chief Complaint Chief Complaint  Patient presents with  . Back Pain    HPI Derek Fernandez is a 28 y.o. male.   Maison reports lower back pain that started 3 days ago. He recalls bending over doing housework and felt a sharp pain upon standing. The back pain has continued since then, across his midline lower back. He reports his pain is a 6.5/10 that is constant with intermittent sharp pain when twisting or bending. He feels a pressure sensation when lying or sitting. He denies loss of bladder or bowel control, radiation of pain down his legs, or numbness, or tingling in his lower extremities. He has tried extra strength Tylenol and Biofreeze patches with minimal relief.      Past Medical History:  Diagnosis Date  . Personal history of asthma     There are no active problems to display for this patient.   Past Surgical History:  Procedure Laterality Date  . TONSILLECTOMY         Home Medications    Prior to Admission medications   Medication Sig Start Date End Date Taking? Authorizing Provider  cetirizine-pseudoephedrine (ZYRTEC-D) 5-120 MG tablet Take 1 tablet by mouth 2 (two) times daily.   Yes [provider]  meloxicam (MOBIC) 7.5 MG tablet Take 1 tablet (7.5 mg total) by mouth daily. 04/15/19   Tasia Catchings, Austen Oyster V, PA-C  methocarbamol (ROBAXIN) 500 MG tablet Take 1 tablet (500 mg total) by mouth 2 (two) times daily. 04/15/19   Ok Edwards, PA-C    Family History Family History  Problem Relation Age of Onset  . Migraines Father        severe & give stroke like symptoms  . Diabetes Paternal Grandmother   . Heart disease Paternal Grandmother   . Diabetes Paternal Grandfather   . Heart disease Paternal Grandfather     Social History Social History   Tobacco Use  . Smoking status: Never Smoker  . Smokeless tobacco: Current User    Types: Snuff  Substance Use  Topics  . Alcohol use: Yes    Comment: Occassional  . Drug use: Never     Allergies   Patient has no known allergies.   Review of Systems Review of Systems see HPI.   Physical Exam Triage Vital Signs ED Triage Vitals  Enc Vitals Group     BP 04/15/19 0940 121/84     Pulse Rate 04/15/19 0940 74     Resp 04/15/19 0940 18     Temp 04/15/19 0940 98.3 F (36.8 C)     Temp Source 04/15/19 0940 Oral     SpO2 04/15/19 0940 97 %     Weight --      Height --      Head Circumference --      Peak Flow --      Pain Score 04/15/19 0942 7     Pain Loc --      Pain Edu? --      Excl. in [place]? --    No data found.  Updated Vital Signs BP 121/84 (BP Location: Left Arm)   Pulse 74   Temp 98.3 F (36.8 C) (Oral)   Resp 18   SpO2 97%   Physical Exam Constitutional:      Appearance: Normal appearance.  Cardiovascular:     Rate and Rhythm: Normal  rate and regular rhythm.  Pulmonary:     Effort: Pulmonary effort is normal.     Breath sounds: Normal breath sounds.  Musculoskeletal:     Thoracic back: He exhibits tenderness. He exhibits no swelling, no edema and no pain.     Lumbar back: He exhibits tenderness, swelling, edema and pain.     Comments: Tenderness to palpation diffusely to the midline of thoracic and lumber region. No focal spinous process tenderness. Pain in lower back with straight leg raise, pain does not radiate to legs.  Skin:    General: Skin is warm and dry.     Capillary Refill: Capillary refill takes 2 to 3 seconds.  Neurological:     Mental Status: He is alert and oriented to person, place, and time.  Psychiatric:        Mood and Affect: Mood normal.        Behavior: Behavior normal.      UC Treatments / Results  Labs (all labs ordered are listed, but only abnormal results are displayed) Labs Reviewed - No data to display  EKG   Radiology No results found.  Procedures Procedures (including critical care time)  Medications Ordered in UC  Medications - No data to display  Initial Impression / Assessment and Plan / UC Course  I have reviewed the triage vital signs and the nursing notes.  Pertinent labs & imaging results that were available during my care of the patient were reviewed by me and considered in my medical decision making (see chart for details).    Strain likely based on diffuse location of tenderness and pain. No indications for xray at this times. Prescriptions for Mobic and Robaxin x 10 days given. Instructions given for when to go to ED. Follow up with PCP if no improvement in 3-4 weeks.   Final Clinical Impressions(s) / UC Diagnoses   Final diagnoses:  Acute midline low back pain without sciatica    ED Prescriptions    Medication Sig Dispense Auth. Provider   meloxicam (MOBIC) 7.5 MG tablet Take 1 tablet (7.5 mg total) by mouth daily. 10 tablet Fernandez Kenley V, PA-C   methocarbamol (ROBAXIN) 500 MG tablet Take 1 tablet (500 mg total) by mouth 2 (two) times daily. 20 tablet Threasa AlphaYu, Amonie Wisser V, PA-C        Marielle Mantione V, [place]PA-C 04/15/19 1029

## 2019-04-15 NOTE — Discharge Instructions (Signed)
Start Mobic. Do not take ibuprofen (motrin/advil)/ naproxen (aleve) while on mobic.Robaxin as needed, this can make you drowsy, so do not take if you are going to drive, operate heavy machinery, or make important decisions. Ice/heat compresses as needed. This can take up to 3-4 weeks to completely resolve, but you should be feeling better each week. Follow up with PCP if symptoms worsen, changes for reevaluation. If experience numbness/tingling of the inner thighs, loss of bladder or bowel control, go to the emergency department for evaluation.  ° °

## 2019-05-10 ENCOUNTER — Encounter: Payer: Self-pay | Admitting: Family Medicine

## 2019-09-16 ENCOUNTER — Encounter: Payer: Self-pay | Admitting: Internal Medicine

## 2019-09-16 ENCOUNTER — Other Ambulatory Visit: Payer: Self-pay

## 2019-09-16 ENCOUNTER — Ambulatory Visit (INDEPENDENT_AMBULATORY_CARE_PROVIDER_SITE_OTHER): Payer: 59 | Admitting: Internal Medicine

## 2019-09-16 VITALS — BP 135/85 | HR 79 | Temp 97.3°F | Resp 17 | Ht 70.0 in | Wt 324.8 lb

## 2019-09-16 DIAGNOSIS — R0981 Nasal congestion: Secondary | ICD-10-CM

## 2019-09-16 DIAGNOSIS — Z6841 Body Mass Index (BMI) 40.0 and over, adult: Secondary | ICD-10-CM

## 2019-09-16 MED ORDER — FLUTICASONE PROPIONATE 50 MCG/ACT NA SUSP: 2.0000 | Freq: Every day | NASAL | 6 refills | Status: DC

## 2019-09-16 NOTE — Patient Instructions (Signed)
Healthy Eating Following a healthy eating pattern may help you to achieve and maintain a healthy body weight, reduce the risk of chronic disease, and live a long and productive life. It is important to follow a healthy eating pattern at an appropriate calorie level for your body. Your nutritional needs should be met primarily through food by choosing a variety of nutrient-rich foods. What are tips for following this plan? Reading food labels  Read labels and choose the following: ? Reduced or low sodium. ? Juices with 100% fruit juice. ? Foods with low saturated fats and high polyunsaturated and monounsaturated fats. ? Foods with whole grains, such as whole wheat, cracked wheat, brown rice, and wild rice. ? Whole grains that are fortified with folic acid. This is recommended for women who are pregnant or who want to become pregnant.  Read labels and avoid the following: ? Foods with a lot of added sugars. These include foods that contain brown sugar, corn sweetener, corn syrup, dextrose, fructose, glucose, high-fructose corn syrup, honey, invert sugar, lactose, malt syrup, maltose, molasses, raw sugar, sucrose, trehalose, or turbinado sugar.  Do not eat more than the following amounts of added sugar per day:  6 teaspoons (25 g) for women.  9 teaspoons (38 g) for men. ? Foods that contain processed or refined starches and grains. ? Refined grain products, such as white flour, degermed cornmeal, white bread, and white rice. Shopping  Choose nutrient-rich snacks, such as vegetables, whole fruits, and nuts. Avoid high-calorie and high-sugar snacks, such as potato chips, fruit snacks, and candy.  Use oil-based dressings and spreads on foods instead of solid fats such as butter, stick margarine, or cream cheese.  Limit pre-made sauces, mixes, and "instant" products such as flavored rice, instant noodles, and ready-made pasta.  Try more plant-protein sources, such as tofu, tempeh, black beans,  edamame, lentils, nuts, and seeds.  Explore eating plans such as the Mediterranean diet or vegetarian diet. Cooking  Use oil to saut or stir-fry foods instead of solid fats such as butter, stick margarine, or lard.  Try baking, boiling, grilling, or broiling instead of frying.  Remove the fatty part of meats before cooking.  Steam vegetables in water or broth. Meal planning   At meals, imagine dividing your plate into fourths: ? One-half of your plate is fruits and vegetables. ? One-fourth of your plate is whole grains. ? One-fourth of your plate is protein, especially lean meats, poultry, eggs, tofu, beans, or nuts.  Include low-fat dairy as part of your daily diet. Lifestyle  Choose healthy options in all settings, including home, work, school, restaurants, or stores.  Prepare your food safely: ? Wash your hands after handling raw meats. ? Keep food preparation surfaces clean by regularly washing with hot, soapy water. ? Keep raw meats separate from ready-to-eat foods, such as fruits and vegetables. ? Cook seafood, meat, poultry, and eggs to the recommended internal temperature. ? Store foods at safe temperatures. In general:  Keep cold foods at 59F (4.4C) or below.  Keep hot foods at 159F (60C) or above.  Keep your freezer at [facility] (-17.8C) or below.  Foods are no longer safe to eat when they have been between the temperatures of 40-159F (4.4-60C) for more than 2 hours. What foods should I eat? Fruits Aim to eat 2 cup-equivalents of fresh, canned (in natural juice), or frozen fruits each day. Examples of 1 cup-equivalent of fruit include 1 small apple, 8 large strawberries, 1 cup canned fruit,  cup  dried fruit, or 1 cup 100% juice. Vegetables Aim to eat 2-3 cup-equivalents of fresh and frozen vegetables each day, including different varieties and colors. Examples of 1 cup-equivalent of vegetables include 2 medium carrots, 2 cups raw, leafy greens, 1 cup chopped  vegetable (raw or cooked), or 1 medium baked potato. Grains Aim to eat 6 ounce-equivalents of whole grains each day. Examples of 1 ounce-equivalent of grains include 1 slice of bread, 1 cup ready-to-eat cereal, 3 cups popcorn, or  cup cooked rice, pasta, or cereal. Meats and other proteins Aim to eat 5-6 ounce-equivalents of protein each day. Examples of 1 ounce-equivalent of protein include 1 egg, 1/2 cup nuts or seeds, or 1 tablespoon (16 g) peanut butter. A cut of meat or fish that is the size of a deck of cards is about 3-4 ounce-equivalents.  Of the protein you eat each week, try to have at least 8 ounces come from seafood. This includes salmon, trout, herring, and anchovies. Dairy Aim to eat 3 cup-equivalents of fat-free or low-fat dairy each day. Examples of 1 cup-equivalent of dairy include 1 cup (240 mL) milk, 8 ounces (250 g) yogurt, 1 ounces (44 g) natural cheese, or 1 cup (240 mL) fortified soy milk. Fats and oils  Aim for about 5 teaspoons (21 g) per day. Choose monounsaturated fats, such as canola and olive oils, avocados, peanut butter, and most nuts, or polyunsaturated fats, such as sunflower, corn, and soybean oils, walnuts, pine nuts, sesame seeds, sunflower seeds, and flaxseed. Beverages  Aim for six 8-oz glasses of water per day. Limit coffee to three to five 8-oz cups per day.  Limit caffeinated beverages that have added calories, such as soda and energy drinks.  Limit alcohol intake to no more than 1 drink a day for nonpregnant women and 2 drinks a day for men. One drink equals 12 oz of beer (355 mL), 5 oz of wine (148 mL), or 1 oz of hard liquor (44 mL). Seasoning and other foods  Avoid adding excess amounts of salt to your foods. Try flavoring foods with herbs and spices instead of salt.  Avoid adding sugar to foods.  Try using oil-based dressings, sauces, and spreads instead of solid fats. This information is based on general [place] nutrition guidelines. For more  information, visit [employer]. Exact amounts may vary based on your nutrition needs. Summary  A healthy eating plan may help you to maintain a healthy weight, reduce the risk of chronic diseases, and stay active throughout your life.  Plan your meals. Make sure you eat the right portions of a variety of nutrient-rich foods.  Try baking, boiling, grilling, or broiling instead of frying.  Choose healthy options in all settings, including home, work, school, restaurants, or stores. This information is not intended to replace advice given to you by your health care provider. Make sure you discuss any questions you have with your health care provider. Document Revised: 11/13/2017 Document Reviewed: 11/13/2017 Elsevier Patient Education  [place].

## 2019-09-16 NOTE — Progress Notes (Signed)
  Subjective:    Derek Fernandez - 29 y.o. male MRN [id number]  Date of birth: 1990/09/04  HPI  Derek Fernandez is here for follow up of weight. Reports he portions his meals. Bought containers. Taking smaller portions for lunch and eating less. Eats less junk food. Eats baked chicken sandwiches, hamburgers, steak, little bit of pasta. Sometimes eats veggies. Reports due to his job, can't exercise during the day. Walking a mile each day over the weekend, takes 15-20 minutes.    Requests ENT referral for chronic nasal congestion, has been occurring for >6 months. Wakes him up in the middle of the night having to blow his nose. Has to do the same thing in the morning. Denies itchy/watery eyes. Occasionally feels some pressure in his sinus. No cough or sore throat. No ear pain. Has never tried anything.    Health Maintenance Due  Topic Date Due  . HIV Screening  05/01/2008    -  reports that he has never smoked. His smokeless tobacco use includes snuff. - Review of Systems: Per HPI. - Past Medical History: There are no problems to display for this patient.  - Medications: reviewed and updated   Objective:   Physical Exam BP 135/85   Pulse 79   Temp (!) 97.3 F (36.3 C) (Temporal)   Resp 17   Ht 5\' 10"  (1.778 m)   Wt (!) 324 lb 12.8 oz (147.3 kg)   SpO2 98%   BMI 46.60 kg/m  Physical Exam  Constitutional: He is oriented to person, place, and time and well-developed, well-nourished, and in no distress. No distress.  HENT:  Head: Normocephalic and atraumatic.  Mouth/Throat: Oropharynx is clear and moist.  Nose with swollen turbinates bilaterally, nasal cavities appeared constricted. Deviated septum not grossly appreciated.   Eyes: Conjunctivae and EOM are normal.  Cardiovascular: Normal rate and regular rhythm.  No murmur heard. Pulmonary/Chest: Effort normal and breath sounds normal. No respiratory distress.  Musculoskeletal:        General: Normal range of motion.   Neurological: He is alert and oriented to person, place, and time.  Skin: Skin is warm and dry. He is not diaphoretic.  Psychiatric: Affect and judgment normal.           Assessment & Plan:   1. Class 3 severe obesity due to excess calories without serious comorbidity with body mass index (BMI) of 45.0 to 49.9 in adult (HCC) No change in BMI in 6 months. Encouraged increasing walking to at least 2 miles daily over the weekend and to walk at a speed it would not feel comfortable to talk. As we gain more daylight in the evenings, encouraged walking during the week. Advised 150 minutes of exercise per week is recommended. Discussed healthy meal choices. Will refer to nutrition for further counseling.  - Amb ref to Medical Nutrition Therapy-MNT  2. Chronic nasal congestion Will try nasal corticosteroid for symptom relief. Patient requests ENT referral for further eval.  - fluticasone (FLONASE) 50 MCG/ACT nasal spray; Place 2 sprays into both nostrils daily.  Dispense: 16 g; Refill: 6 - Ambulatory referral to ENT    , D.O. 09/16/2019, 8:53 AM Primary Care at [facility]

## 2019-10-16 ENCOUNTER — Ambulatory Visit: Payer: 59 | Admitting: Skilled Nursing Facility1

## 2019-11-11 ENCOUNTER — Other Ambulatory Visit: Payer: Self-pay

## 2019-11-11 ENCOUNTER — Encounter: Payer: 59 | Attending: Internal Medicine | Admitting: Nutrition

## 2019-11-11 ENCOUNTER — Encounter: Payer: Self-pay | Admitting: Nutrition

## 2019-11-11 VITALS — Ht 71.0 in | Wt 327.0 lb

## 2019-11-11 DIAGNOSIS — E66813 Obesity, class 3: Secondary | ICD-10-CM

## 2019-11-11 DIAGNOSIS — E782 Mixed hyperlipidemia: Secondary | ICD-10-CM | POA: Insufficient documentation

## 2019-11-11 NOTE — Patient Instructions (Addendum)
Goals  Increase low carb vegetables to 2-3 per day Increase exercise to 2-3  Derek Fernandez on weekends and at night when able.  Cut out grazing between meals. Drink a gallon of water every day. Cut juices, tea and limit milk intake. Lose 5 lbs per month

## 2019-11-11 NOTE — Progress Notes (Signed)
Medical Nutrition Therapy:  Appt start time: 1300 end time:  1400.   Assessment:  Primary concerns today: Obesity. He is married . He and wife share cooking, shopping. Wants to lose weight. Wants to weight in lower 200's. Has gained 27 lbs in the last year. 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 Eats 2 meals per day.  Physical activity: tries to walk some on weekends.  Eats out once a day. Meals at home are usually air fried or baked. Drinks milk or water. Skips breakfast  Drinks some sweet tea. He has diarrhea a lot and takes a probiotic to help reduce diarrhea.. Current diet is high in fat, sodium and CHO contributing to weight gain.   CMP Latest Ref Rng & Units 03/14/2019 02/08/2019 03/23/2018  Glucose 65 - 99 mg/dL 87 110(H) 97  BUN 6 - 20 mg/dL _0 Creatinine 0.76 - 1.27 mg/dL 1.13 0.85 0.92  Sodium 134 - 144 mmol/L 142 141 140  Potassium 3.5 - 5.2 mmol/L 4.6 3.6 4.6  Chloride 96 - 106 mmol/L 101 107 107  CO2 20 - 29 mmol/L _1 Calcium 8.7 - 10.2 mg/dL 9.2 8.7(L) 9.1  Total Protein 6.0 - 8.5 g/dL 7.2 - 7.3  Total Bilirubin 0.0 - 1.2 mg/dL 0.4 - 1.1  Alkaline Phos 39 - 117 IU/L 52 - 48  AST 0 - 40 IU/L 37 - 40  ALT 0 - 44 IU/L 63(H) - 54(H)   Lipid Panel     Component Value Date/Time   CHOL 185 03/14/2019 0918   TRIG 252 (H) 03/14/2019 0918   HDL 43 03/14/2019 0918   CHOLHDL 4.3 03/14/2019 0918   LDLCALC 92 03/14/2019 0918   LABVLDL 50 (H) 03/14/2019 0918    Preferred Learning Style:   No preference indicated   Learning Readiness:  Ready  Change in progress   MEDICATIONS:   DIETARY INTAKE:   24-hr recall:  B ( AM):  Biscuits 2 and gravy, milk-2% milk 12-16 oz Snk ( AM):  Chips and queso L ( PM): Grilled cheese steak sandwiches, mac/cheese, arazona tea 12 oz Snk ( PM):  D ( PM): skipped:   Hotdogs, pizzas or premade mixed vegetables, meat. Snk ( PM):  Beverages: milk, tea, water 80 oz per day.  Usual physical activity: walks  some.  Estimated energy needs: 1800-2000 calories 200 g carbohydrates 135 g protein 50 g fat  Progress Towards Goal(s):  In progress.   Nutritional Diagnosis:  NB-1.1 Food and nutrition-related knowledge deficit As related to Obesity.  As evidenced by BMI 45.    Intervention:  Nutrition and weight loss education provided on My Plate, CHO counting, meal planning, portion sizes, timing of meals, avoiding snacks between meals, taking medications as prescribed, benefits of exercising 60 minutes per day and prevention of DM. Healthy weight loss tips.  . Goals  Increase low carb vegetables to 2-3 per day Increase exercise to 2-3  [employer] on weekends and at night when able.  Cut out grazing between meals. Drink a gallon of water every day. Cut juices, tea and limit milk intake.   Teaching Method Utilized:  Visual Auditory Hands on  Handouts given during visit include:  The Plate Method   Meal Plan Card  Weight loss tips  Barriers to learning/adherence to lifestyle change: mobility due to size  Demonstrated degree of understanding via:  Teach Back   Monitoring/Evaluation:  Dietary intake, exercise,  and body weight in 1  month(s).

## 2019-12-12 ENCOUNTER — Other Ambulatory Visit: Payer: Self-pay

## 2019-12-12 ENCOUNTER — Encounter: Payer: 59 | Attending: Internal Medicine | Admitting: Nutrition

## 2019-12-12 ENCOUNTER — Encounter: Payer: Self-pay | Admitting: Nutrition

## 2019-12-12 VITALS — Ht 72.0 in | Wt 322.0 lb

## 2019-12-12 NOTE — Progress Notes (Signed)
  Medical Nutrition Therapy:  Appt start time: 0900 end time:  0930   Assessment:  Primary concerns today: Obesity.  Changes made: making better foods,.better portions, cut out sodas and eating more foods baked and broiled. Exercise: yard work.        Taking a probiotic to helop with his stomach. Silk milk is helpful too. Feels better. More energy. Lost 5  Lbs since last visit. Wt Readings from Last 3 Encounters:  12/12/19 (!) 322 lb (146.1 kg)  11/11/19 (!) 327 lb (148.3 kg)  09/16/19 (!) 324 lb 12.8 oz (147.3 kg)   Ht Readings from Last 3 Encounters:  12/12/19 6' (1.829 m)  11/11/19 5\' 11"  (1.803 m)  09/16/19 5\' 10"  (1.778 m)   Body mass index is 43.67 kg/m. @BMIFA @ Facility age limit for growth percentiles is 20 years. Facility age limit for growth percentiles is 20 years.   CMP Latest Ref Rng & Units 03/14/2019 02/08/2019 03/23/2018  Glucose 65 - 99 mg/dL 87 03/16/2019) 97  BUN 6 - 20 mg/dL 10 14 13   Creatinine 0.76 - 1.27 mg/dL 02/10/2019 05/23/2018 962(I  Sodium 134 - 144 mmol/L 142 141 140  Potassium 3.5 - 5.2 mmol/L 4.6 3.6 4.6  Chloride 96 - 106 mmol/L 101 107 107  CO2 20 - 29 mmol/L 25 24 24   Calcium 8.7 - 10.2 mg/dL 9.2 ) 9.1  Total Protein 6.0 - 8.5 g/dL 7.2 - 7.3  Total Bilirubin 0.0 - 1.2 mg/dL 0.4 - 1.1  Alkaline Phos 39 - 117 IU/L 52 - 48  AST 0 - 40 IU/L 37 - 40  ALT 0 - 44 IU/L 63(H) - 54(H)   Lipid Panel     Component Value Date/Time   CHOL 185 03/14/2019 0918   TRIG 252 (H) 03/14/2019 0918   HDL 43 03/14/2019 0918   CHOLHDL 4.3 03/14/2019 0918   LDLCALC 92 03/14/2019 0918   LABVLDL 50 (H) 03/14/2019 0918    Preferred Learning Style:   No preference indicated   Learning Readiness:  Ready  Change in progress   MEDICATIONS:   DIETARY INTAKE:   24-hr recall:  B ( AM): smoothie or kind bar. Snk ( AM):   L ( PM): garlic chicken with mixed vegetables, water Snk ( PM):  D ( PM): 2 slices pizza, 1 c silk milk Snk ( PM):  Beverages: milk,  water 80  oz per day.  Usual physical activity: walks some.  Estimated energy needs: 1800-2000 calories 200 g carbohydrates 135 g protein 50 g fat  Progress Towards Goal(s):  In progress.   Nutritional Diagnosis:  NB-1.1 Food and nutrition-related knowledge deficit As related to Obesity.  As evidenced by BMI 45.    Intervention:  Nutrition and weight loss education provided on My Plate, CHO counting, meal planning, portion sizes, timing of meals, avoiding snacks between meals, taking medications as prescribed, benefits of exercising 60 minutes per day and prevention of DM. Healthy weight loss tips.  . Goals Walk 2 miles 3 times per week. Lose 5 lbs. Continue to increase fresh fruits and vegetables.  Teaching Method Utilized:  Visual Auditory Hands on  Handouts given during visit include:  The Plate Method   Meal Plan Card  Weight loss tips  Barriers to learning/adherence to lifestyle change: mobility due to size  Demonstrated degree of understanding via:  Teach Back   Monitoring/Evaluation:  Dietary intake, exercise,  and body weight in 3   month(s).

## 2019-12-12 NOTE — Patient Instructions (Addendum)
Goals Walk 2 miles 3 times per week. Lose 5 lbs. Continue to increase fresh fruits and vegetables.

## 2019-12-16 ENCOUNTER — Encounter: Payer: Self-pay | Admitting: Nutrition

## 2020-03-19 ENCOUNTER — Encounter: Payer: 59 | Attending: Internal Medicine | Admitting: Nutrition

## 2020-03-19 ENCOUNTER — Other Ambulatory Visit: Payer: Self-pay

## 2020-03-19 ENCOUNTER — Encounter: Payer: Self-pay | Admitting: Nutrition

## 2020-03-19 VITALS — Ht 72.0 in | Wt 322.0 lb

## 2020-03-19 DIAGNOSIS — E782 Mixed hyperlipidemia: Secondary | ICD-10-CM | POA: Insufficient documentation

## 2020-03-19 NOTE — Progress Notes (Signed)
Medical Nutrition Therapy:  Appt start time: 0900 end time:  0930   Assessment:  Primary concerns today: Obesity.   He has been working on making better food choices. Wt is the same as last visit. He wants to know where to work out in [place], [place]. Suggested the [employer]. He is ready to start working out.  Only drinks water now. Eating meals on time and has done well with improving intake of fruit and vegetables and cutting out processed foods for the most part.   Goals Walk 2 miles 3 times per week.- wants to join the [facility] Lose 5 lbs.- no weight loss since last visit. Continue to increase fresh fruits and vegetables.- has done well.   Wt Readings from Last 3 Encounters:  12/12/19 (!) 322 lb (146.1 kg)  11/11/19 (!) 327 lb (148.3 kg)  09/16/19 (!) 324 lb 12.8 oz (147.3 kg)   Ht Readings from Last 3 Encounters:  12/12/19 6' (1.829 m)  11/11/19 5\' 11"  (1.803 m)  09/16/19 5\' 10"  (1.778 m)   There is no height or weight on file to calculate BMI. @BMIFA @ Facility age limit for growth percentiles is 20 years. Facility age limit for growth percentiles is 20 years.   CMP Latest Ref Rng & Units 03/14/2019 02/08/2019 03/23/2018  Glucose 65 - 99 mg/dL 87 03/16/2019) 97  BUN 6 - 20 mg/dL 10 14 13   Creatinine 0.76 - 1.27 mg/dL 02/10/2019 05/23/2018 161(W  Sodium 134 - 144 mmol/L 142 141 140  Potassium 3.5 - 5.2 mmol/L 4.6 3.6 4.6  Chloride 96 - 106 mmol/L 101 107 107  CO2 20 - 29 mmol/L 25 24 24   Calcium 8.7 - 10.2 mg/dL 9.2 ) 9.1  Total Protein 6.0 - 8.5 g/dL 7.2 - 7.3  Total Bilirubin 0.0 - 1.2 mg/dL 0.4 - 1.1  Alkaline Phos 39 - 117 IU/L 52 - 48  AST 0 - 40 IU/L 37 - 40  ALT 0 - 44 IU/L 63(H) - 54(H)   Lipid Panel     Component Value Date/Time   CHOL 185 03/14/2019 0918   TRIG 252 (H) 03/14/2019 0918   HDL 43 03/14/2019 0918   CHOLHDL 4.3 03/14/2019 0918   LDLCALC 92 03/14/2019 0918   LABVLDL 50 (H) 03/14/2019 0918    Preferred Learning Style:   No preference indicated   Learning  Readiness:  Ready  Change in progress   MEDICATIONS:   DIETARY INTAKE:   24-hr recall:  B ( AM): [place] or rice pudding  With  Snk ( AM):   L ( PM): 2 slices pizza; takes lunch and packs a salad , water Snk ( PM):  D ( PM): 2 slices pizza, water Snk ( PM):  Beverages: milk,  water 80 oz per day.  Usual physical activity: walks some.  Estimated energy needs: 1800-2000 calories 200 g carbohydrates 135 g protein 50 g fat  Progress Towards Goal(s):  In progress.   Nutritional Diagnosis:  NB-1.1 Food and nutrition-related knowledge deficit As related to Obesity.  As evidenced by BMI 45.    Intervention:  Nutrition and weight loss education provided on My Plate, CHO counting, meal planning, portion sizes, timing of meals, avoiding snacks between meals, taking medications as prescribed, benefits of exercising 60 minutes per day and prevention of DM. Healthy weight loss tips.   03/16/2019 Try kind bars and greek yogurts Add more protein to meals. Increase water to gallon a day. Walk on treadmill 30 minutes  three times per week. Lose 1 lb per week   Teaching Method Utilized:  Visual Auditory Hands on  Handouts given during visit include:  The Plate Method   Meal Plan Card  Weight loss tips  Barriers to learning/adherence to lifestyle change: mobility due to size  Demonstrated degree of understanding via:  Teach Back   Monitoring/Evaluation:  Dietary intake, exercise,  and body weight in 2   month(s).

## 2020-03-19 NOTE — Patient Instructions (Addendum)
[employer] Try kind bars and greek yogurts Add more protein to meals. Increase water to gallon a day. Walk on treadmill 30 minutes three times per week. Lose 1 lb per week.

## 2020-03-19 NOTE — Patient Instructions (Addendum)
Keeping you healthy  Get these tests  Blood pressure- Have your blood pressure checked once a year by your healthcare provider.  Normal blood pressure is 120/80.  Weight- Have your body mass index (BMI) calculated to screen for obesity.  BMI is a measure of body fat based on height and weight. You can also calculate your own BMI at https://www.west-esparza.com/.  Cholesterol- Have your cholesterol checked regularly starting at age 27, sooner may be necessary if you have diabetes, high blood pressure, if a family member developed heart diseases at an early age or if you smoke.   Chlamydia, HIV, and other sexual transmitted disease- Get screened each year until the age of 58 then within three months of each new sexual partner.  Diabetes- Have your blood sugar checked regularly if you have high blood pressure, high cholesterol, a family history of diabetes or if you are overweight.  Get these vaccines  Flu shot- Every fall.  Tetanus shot- Every 10 years.  Menactra- Single dose; prevents meningitis.  Take these steps  Don't smoke- If you do smoke, ask your healthcare provider about quitting. For tips on how to quit, go to www.smokefree.gov or call 1-800-QUIT-NOW.  Be physically active- Exercise 5 days a week for at least 30 minutes.  If you are not already physically active start slow and gradually work up to 30 minutes of moderate physical activity.  Examples of moderate activity include walking briskly, mowing the yard, dancing, swimming bicycling, etc.  Eat a healthy diet- Eat a variety of healthy foods such as fruits, vegetables, low fat milk, low fat cheese, yogurt, lean meats, poultry, fish, beans, tofu, etc.  For more information on healthy eating, go to www.thenutritionsource.org  Drink alcohol in moderation- Limit alcohol intake two drinks or less a day.  Never drink and drive.  Dentist- Brush and floss teeth twice daily; visit your dentis twice a year.  Depression-Your emotional  health is as important as your physical health.  If you're feeling down, losing interest in things you normally enjoy please talk with your healthcare provider.  Gun Safety- If you keep a gun in your home, keep it unloaded and with the safety lock on.  Bullets should be stored separately.  Helmet use- Always wear a helmet when riding a motorcycle, bicycle, rollerblading or skateboarding.  Safe sex- If you may be exposed to a sexually transmitted infection, use a condom  Seat belts- Seat bels can save your life; always wear one.  Smoke/Carbon Monoxide detectors- These detectors need to be installed on the appropriate level of your home.  Replace batteries at least once a year.  Skin Cancer- When out in the sun, cover up and use sunscreen SPF 15 or higher.  Violence- If anyone is threatening or hurting you, please tell your healthcare provider.   The [facility] [address] [place], [place] [address]   [phone]  We are pleased to offer you the following services as part of evaluation and management of infertility.  Please note that we are specialized in general obstetrics and gynecology, and we are not infertility specialists who had to undergo additional years of training.  If you desire, we can give you an outright referral to an infertility specialist.  Prior to initiating any therapy for infertility, we require that you undergo a hysterosalpingogram (HSG) to evaluate your reproductive system and your male partner has to undergo a semen analysis.  These results need to be received by our clinic before  we recommend any treatments.  The hysterosalpingogram and semen analysis can be performed at the following places; prices are approximate and are subject to change:  1) [facility] Semen Analysis: Not offered Hysterosalpingogram: Facility Fee $600  Reading Fee $230 This is the discounted self-pay price; needs to be paid up  front.  2) [facility]   [phone]   [id number] [facility] [address], [place] [address] Semen Analysis: [phone] Hysterosalpingogram: Performed at [facility] Facility Fee [phone]  Reading Fee $305     Self pay patients get 20% of the reading fee and 40% discount off the facility fee if bill is is paid up front  3) [facility] [phone]  [address] [place], [place] [address] Semen Analysis:  $120 Hysterosalpingogram: Performed in their facility  Facility Fee $1200  Reading Fee [phone]  Self pay patients get 40% discount off the facility fee if bill is is paid up front  After these results are obtained; we will evaluate them and recommend appropriate subsequent therapy which may involving medications and/or surgery. There is a $200 fee for initial consultation; and $100 fee per visit for any subsequent visits.  Thank you for choosing [place] and our services.  Please call [phone] for any further questions or concerns regarding infertility management or other gynecologic issues.

## 2020-03-20 ENCOUNTER — Ambulatory Visit (INDEPENDENT_AMBULATORY_CARE_PROVIDER_SITE_OTHER): Payer: 59 | Admitting: Internal Medicine

## 2020-03-20 ENCOUNTER — Other Ambulatory Visit: Payer: Self-pay

## 2020-03-20 ENCOUNTER — Encounter: Payer: Self-pay | Admitting: Internal Medicine

## 2020-03-20 VITALS — BP 130/85 | HR 75 | Temp 97.3°F | Resp 17 | Ht 70.0 in | Wt 320.0 lb

## 2020-03-20 DIAGNOSIS — Z0000 Encounter for general adult medical examination without abnormal findings: Secondary | ICD-10-CM

## 2020-03-20 DIAGNOSIS — Z114 Encounter for screening for human immunodeficiency virus [HIV]: Secondary | ICD-10-CM | POA: Diagnosis not present

## 2020-03-20 DIAGNOSIS — G4700 Insomnia, unspecified: Secondary | ICD-10-CM

## 2020-03-20 DIAGNOSIS — Z1159 Encounter for screening for other viral diseases: Secondary | ICD-10-CM

## 2020-03-20 DIAGNOSIS — Z113 Encounter for screening for infections with a predominantly sexual mode of transmission: Secondary | ICD-10-CM

## 2020-03-20 MED ORDER — TRAZODONE HCL 50 MG PO TABS: 25.0000 mg | ORAL_TABLET | Freq: Every evening | ORAL | 3 refills | Status: DC | PRN

## 2020-03-20 NOTE — Progress Notes (Signed)
Subjective:    Derek Fernandez - 29 y.o. male MRN [id number]  Date of birth: 1990/08/19  HPI  Derek Fernandez is here for annual exam. Has questions about infertility---his partner and him have been struggling to conceive for 4 years. Discussed would recommend his partner establish with Ob to discuss concerns and then determine if patient needs semen analysis. Asks if ok to take antihistamine with his Flonase. Discussed that dual therapy for allergic rhinitis is fine. He is not fasting--ate bacon, egg, and cheese croissant with a smoothie this morning. Seeing nutritionist. Wants him to start walking every day.    Also having a lot of trouble with sleeping. Thinks this is due to the passing of his grandfather. Seems to do okay during day but when lays down at night he thinks about his grandfather, gets upset, and can't turn off his mind.   Health Maintenance:  Health Maintenance Due  Topic Date Due  . Hepatitis C Screening  Never done  . HIV Screening  Never done  . COVID-19 Vaccine (2 - Moderna 2-dose series) 11/16/2019  . INFLUENZA VACCINE  03/15/2020    -  reports that he has never smoked. His smokeless tobacco use includes snuff. - Review of Systems: Per HPI. - Past Medical History: There are no problems to display for this patient.  - Medications: reviewed and updated   Objective:   Physical Exam BP 130/85   Pulse 75   Temp (!) 97.3 F (36.3 C) (Temporal)   Resp 17   Ht 5\' 10"  (1.778 m)   Wt (!) 320 lb (145.2 kg)   SpO2 95%   BMI 45.92 kg/m  Physical Exam Constitutional:      Appearance: He is not diaphoretic.  HENT:     Head: Normocephalic and atraumatic.  Eyes:     Conjunctiva/sclera: Conjunctivae normal.     Pupils: Pupils are equal, round, and reactive to light.  Neck:     Thyroid: No thyromegaly.  Cardiovascular:     Rate and Rhythm: Normal rate and regular rhythm.     Heart sounds: Normal heart sounds. No murmur heard.   Pulmonary:     Effort:  Pulmonary effort is normal. No respiratory distress.     Breath sounds: Normal breath sounds. No wheezing.  Abdominal:     General: Bowel sounds are normal. There is no distension.     Palpations: Abdomen is soft.     Tenderness: There is no abdominal tenderness. There is no guarding or rebound.  Musculoskeletal:        General: No deformity. Normal range of motion.     Cervical back: Normal range of motion and neck supple.  Lymphadenopathy:     Cervical: No cervical adenopathy.  Skin:    General: Skin is warm and dry.     Findings: No rash.  Neurological:     Mental Status: He is alert and oriented to person, place, and time.     Gait: Gait is intact.  Psychiatric:        Mood and Affect: Mood and affect normal.        Judgment: Judgment normal.        Assessment & Plan:    1. Annual physical exam Counseled on 150 minutes of exercise per week, healthy eating (including decreased daily intake of saturated fats, cholesterol, added sugars, sodium), STI prevention, routine healthcare maintenance. - CBC with Differential - Comprehensive metabolic panel - Lipid Panel  2. Need for hepatitis  C screening test - HCV Ab w/Rflx to Verification  3. Screening for HIV (human immunodeficiency virus) - HIV antibody (with reflex)  4. Screening for STD (sexually transmitted disease) - HIV antibody (with reflex) - GC/Chlamydia Probe Amp - RPR  5. 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 Trial of Trazodone. Discussed appropriate use and potential side effects. Advised on sleep hygiene.  - traZODone (DESYREL) 50 MG tablet; Take 0.5-1 tablets (25-50 mg total) by mouth at bedtime as needed for sleep.  Dispense: 30 tablet; Refill: 3     Marcy Siren, D.O. 03/20/2020, 10:47 AM Primary Care at [facility]

## 2020-03-21 LAB — CBC WITH DIFFERENTIAL/PLATELET
Basophils Absolute: 0.1 10*3/uL (ref 0.0–0.2)
Basos: 1 %
EOS (ABSOLUTE): 0.2 10*3/uL (ref 0.0–0.4)
Eos: 2 %
Hematocrit: 48.1 % (ref 37.5–51.0)
Hemoglobin: 15.8 g/dL (ref 13.0–17.7)
Immature Grans (Abs): 0.1 10*3/uL (ref 0.0–0.1)
Immature Granulocytes: 1 %
Lymphocytes Absolute: 2.6 10*3/uL (ref 0.7–3.1)
Lymphs: 26 %
MCH: 29.9 pg (ref 26.6–33.0)
MCHC: 32.8 g/dL (ref 31.5–35.7)
MCV: 91 fL (ref 79–97)
Monocytes Absolute: 0.9 10*3/uL (ref 0.1–0.9)
Monocytes: 9 %
Neutrophils Absolute: 6.1 10*3/uL (ref 1.4–7.0)
Neutrophils: 61 %
Platelets: 320 10*3/uL (ref 150–450)
RBC: 5.29 x10E6/uL (ref 4.14–5.80)
RDW: 13.2 % (ref 11.6–15.4)
WBC: 9.9 10*3/uL (ref 3.4–10.8)

## 2020-03-21 LAB — COMPREHENSIVE METABOLIC PANEL
ALT: 53 IU/L — ABNORMAL HIGH (ref 0–44)
AST: 41 IU/L — ABNORMAL HIGH (ref 0–40)
Albumin/Globulin Ratio: 1.9 (ref 1.2–2.2)
Albumin: 4.7 g/dL (ref 4.1–5.2)
Alkaline Phosphatase: 61 IU/L (ref 48–121)
BUN/Creatinine Ratio: 13 (ref 9–20)
BUN: 12 mg/dL (ref 6–20)
Bilirubin Total: 0.3 mg/dL (ref 0.0–1.2)
CO2: 23 mmol/L (ref 20–29)
Calcium: 9.6 mg/dL (ref 8.7–10.2)
Chloride: 102 mmol/L (ref 96–106)
Creatinine, Ser: 0.96 mg/dL (ref 0.76–1.27)
GFR calc Af Amer: 126 mL/min/{1.73_m2} (ref >59)
GFR calc non Af Amer: 109 mL/min/{1.73_m2} (ref >59)
Globulin, Total: 2.5 g/dL (ref 1.5–4.5)
Glucose: 88 mg/dL (ref 65–99)
Potassium: 4 mmol/L (ref 3.5–5.2)
Sodium: 141 mmol/L (ref 134–144)
Total Protein: 7.2 g/dL (ref 6.0–8.5)

## 2020-03-21 LAB — LIPID PANEL
Chol/HDL Ratio: 4.5 ratio (ref 0.0–5.0)
Cholesterol, Total: 187 mg/dL (ref 100–199)
HDL: 42 mg/dL (ref >39)
LDL Chol Calc (NIH): 78 mg/dL (ref 0–99)
Triglycerides: 421 mg/dL — ABNORMAL HIGH (ref 0–149)
VLDL Cholesterol Cal: 67 mg/dL — ABNORMAL HIGH (ref 5–40)

## 2020-03-21 LAB — RPR: RPR Ser Ql: NONREACTIVE

## 2020-03-21 LAB — HCV INTERPRETATION

## 2020-03-21 LAB — HCV AB W/RFLX TO VERIFICATION: HCV Ab: <0.1 s/co ratio (ref 0.0–0.9)

## 2020-03-21 LAB — HIV ANTIBODY (ROUTINE TESTING W REFLEX): HIV Screen 4th Generation wRfx: NONREACTIVE

## 2020-03-22 LAB — GC/CHLAMYDIA PROBE AMP
Chlamydia trachomatis, NAA: NEGATIVE
Neisseria Gonorrhoeae by PCR: NEGATIVE

## 2020-05-20 ENCOUNTER — Other Ambulatory Visit: Payer: Self-pay

## 2020-05-20 DIAGNOSIS — R0981 Nasal congestion: Secondary | ICD-10-CM

## 2020-05-20 MED ORDER — FLUTICASONE PROPIONATE 50 MCG/ACT NA SUSP: 2.0000 | Freq: Every day | NASAL | 6 refills | Status: DC

## 2020-05-21 ENCOUNTER — Ambulatory Visit: Payer: 59 | Admitting: Nutrition

## 2020-06-05 ENCOUNTER — Ambulatory Visit: Payer: 59 | Admitting: Dietician

## 2020-06-26 ENCOUNTER — Other Ambulatory Visit: Payer: Self-pay

## 2020-06-26 ENCOUNTER — Encounter: Payer: 59 | Attending: Internal Medicine | Admitting: Dietician

## 2020-06-26 ENCOUNTER — Encounter: Payer: Self-pay | Admitting: Dietician

## 2020-06-26 VITALS — Ht 72.0 in | Wt 333.4 lb

## 2020-06-26 DIAGNOSIS — E669 Obesity, unspecified: Secondary | ICD-10-CM | POA: Diagnosis not present

## 2020-06-26 NOTE — Patient Instructions (Addendum)
Change from whole milk to 1% milk.  Make your meals fit the proportion of the Balanced Plate Model, including eating more non-starchy vegetables.  Look into getting a treadmill, start 30 minute walks 3-4 days a week when you get one!  Start measuring activity in terms of doing at least 30 minutes at a time  Keep on working hard! You have made some great changes so far!

## 2020-06-26 NOTE — Progress Notes (Signed)
Medical Nutrition Therapy:  Appt start time: 1000 end time:  1030   Assessment:  Primary concerns today: Obesity.   Pt has been tracking physical activity with his new watch. Pt reports that he has been eating as instructed, and walking a mile a day (~15 minutes). Pt has been eating less pork and red meat. Cooking with olive oil instead of butter. For breakfast, has been eating KIND bar or protein shake. Has been eating breakfast more consistently since, notices he is a little less hungry come lunch time. Pt looked into [employer], but too expensive. Pt runs heavy equipment, seated most of the day. 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 Pt drives to [place] with his wife on the weekends and takes long walks. Pt reports difficulty getting his wife to follow his new dietary patterns.     Wt Readings from Last 3 Encounters:  06/26/20 (!) 333 lb 6.4 oz (151.2 kg)  03/20/20 (!) 320 lb (145.2 kg)  03/19/20 (!) 322 lb (146.1 kg)   Ht Readings from Last 3 Encounters:  06/26/20 6' (1.829 m)  03/20/20 5\' 10"  (1.778 m)  03/19/20 6' (1.829 m)   Body mass index is 45.22 kg/m. @BMIFA @ Facility age limit for growth percentiles is 20 years. Facility age limit for growth percentiles is 20 years.   CMP Latest Ref Rng & Units 03/20/2020 03/14/2019 02/08/2019  Glucose 65 - 99 mg/dL 88 87 03/16/2019)  BUN 6 - 20 mg/dL 12 10 14   Creatinine 0.76 - 1.27 mg/dL 02/10/2019 497(W  Sodium 134 - 144 mmol/L 141 142 141  Potassium 3.5 - 5.2 mmol/L 4.0 4.6 3.6  Chloride 96 - 106 mmol/L 102 101 107  CO2 20 - 29 mmol/L 23 25 24   Calcium 8.7 - 10.2 mg/dL 9.6 9.2 2.63)  Total Protein 6.0 - 8.5 g/dL 7.2 7.2 -  Total Bilirubin 0.0 - 1.2 mg/dL 0.3 0.4 -  Alkaline Phos 48 - 121 IU/L 61 52 -  AST 0 - 40 IU/L 41(H) 37 -  ALT 0 - 44 IU/L 53(H) 63(H) -   Lipid Panel     Component Value Date/Time   CHOL 187 03/20/2020 1101   TRIG 421 (H) 03/20/2020 1101   HDL 42 03/20/2020 1101   CHOLHDL 4.5 03/20/2020 1101   LDLCALC 78 03/20/2020  1101   LABVLDL 67 (H) 03/20/2020 1101    Preferred Learning Style:   No preference indicated   Learning Readiness:  Ready  Change in progress   MEDICATIONS:  Probiotic, Multivitamin  DIETARY INTAKE:   3 meals a day   24-hr recall:  B ( AM): Chex Life cereal, fairlife whole milk Snk ( AM):  none L ( PM): Chicken Salad,red onions, cucumbers, carrots. ICE flavored water Snk ( PM): none D ( PM): Grilled steak on flatbread with green peppers and onion, low-fat mozzerlla cheese, crystal light Snk ( PM): none Beverages: Flavored water, water  Usual physical activity: walks some.  Estimated energy needs: 1800-2000 calories 200 g carbohydrates 135 g protein 50 g fat  Progress Towards Goal(s):  In progress.   Nutritional Diagnosis:  NB-1.1 Food and nutrition-related knowledge deficit As related to Obesity.  As evidenced by BMI 45.    Intervention:  Nutrition and weight loss education provided on My Plate, CHO counting, meal planning, portion sizes, timing of meals, avoiding snacks between meals, taking medications as prescribed, benefits of exercising 60 minutes per day and prevention of DM. Healthy weight loss tips.   Goals:  Change from  whole milk to 1% milk.   Make your meals fit the proportion of the Balanced Plate Model, including eating more non-starchy vegetables.  Look into getting a treadmill, start 30 minute walks 3-4 days a week when you get one!  Start measuring activity in terms of doing at least 30 minutes at a time  Teaching Method Utilized:  Visual Auditory Hands on  Handouts given during visit include:  The Plate Method   Meal Plan Card  Weight loss tips  Barriers to learning/adherence to lifestyle change: mobility due to size  Demonstrated degree of understanding via:  Teach Back   Monitoring/Evaluation:  Dietary intake, exercise, and body weight in 2   month(s).

## 2020-08-28 ENCOUNTER — Encounter: Payer: 59 | Attending: Internal Medicine | Admitting: Dietician

## 2020-08-28 ENCOUNTER — Encounter: Payer: Self-pay | Admitting: Dietician

## 2020-08-28 ENCOUNTER — Other Ambulatory Visit: Payer: Self-pay

## 2020-08-28 VITALS — Ht 72.0 in | Wt 334.2 lb

## 2020-08-28 DIAGNOSIS — E669 Obesity, unspecified: Secondary | ICD-10-CM | POA: Diagnosis present

## 2020-08-28 NOTE — Progress Notes (Signed)
Medical Nutrition Therapy:  Appt start time: 1000 end time:  1030   Assessment:  Primary concerns today: Obesity.   Pt reports "falling off of the wagon" of eating healthy. Pt reports not being able to afford healthy food, been eating Ramen noodles a lot. Pt has started at a new department at work, works 7 days on 7 days, 14 hour shifts. Walking every other week at [facility], 4 laps or about a mile. Pt reports having COVID for 2 weeks last month. Had bad N/V/D. Pt still not able to sleep well since having COVID.  Pt reports only wearing his watch when he is active, sates it will give him a rash if he wears it to sleep. Pt was maintaining weight before COVID. Pt reports getting a scratchy throat when eating cheese or milk products. Pt reports anxiety about not losing weight, concerned dietitian will be disappointed.   Wt Readings from Last 3 Encounters:  06/26/20 (!) 333 lb 6.4 oz (151.2 kg)  03/20/20 (!) 320 lb (145.2 kg)  03/19/20 (!) 322 lb (146.1 kg)   Ht Readings from Last 3 Encounters:  06/26/20 6' (1.829 m)  03/20/20 5\' 10"  (1.778 m)  03/19/20 6' (1.829 m)   There is no height or weight on file to calculate BMI. @BMIFA @ Facility age limit for growth percentiles is 20 years. Facility age limit for growth percentiles is 20 years.   CMP Latest Ref Rng & Units 03/20/2020 03/14/2019 02/08/2019  Glucose 65 - 99 mg/dL 88 87 03/16/2019)  BUN 6 - 20 mg/dL 12 10 14   Creatinine 0.76 - 1.27 mg/dL 02/10/2019 119(E  Sodium 134 - 144 mmol/L 141 142 141  Potassium 3.5 - 5.2 mmol/L 4.0 4.6 3.6  Chloride 96 - 106 mmol/L 102 101 107  CO2 20 - 29 mmol/L 23 25 24   Calcium 8.7 - 10.2 mg/dL 9.6 9.2 1.74)  Total Protein 6.0 - 8.5 g/dL 7.2 7.2 -  Total Bilirubin 0.0 - 1.2 mg/dL 0.3 0.4 -  Alkaline Phos 48 - 121 IU/L 61 52 -  AST 0 - 40 IU/L 41(H) 37 -  ALT 0 - 44 IU/L 53(H) 63(H) -   Lipid Panel     Component Value Date/Time   CHOL 187 03/20/2020 1101   TRIG 421 (H) 03/20/2020 1101   HDL  42 03/20/2020 1101   CHOLHDL 4.5 03/20/2020 1101   LDLCALC 78 03/20/2020 1101   LABVLDL 67 (H) 03/20/2020 1101    Preferred Learning Style:   No preference indicated   Learning Readiness:  Ready  Change in progress   MEDICATIONS:  Probiotic, Multivitamin  DIETARY INTAKE:   3 meals a day   24-hr recall:  B (5:00 AM): Cliff Z-bar, bottle of water   Snk ( AM):  none L (1:30 PM): Ramen Noodles, KIND bar, Sprite  Snk ( PM): none D ( PM): Pizza pepperoni, italian sausage, Sprite  Snk ( PM): none Beverages:   Usual physical activity: walks some.  Estimated energy needs: 1800-2000 calories 200 g carbohydrates 135 g protein 50 g fat  Progress Towards Goal(s):  In progress.   Nutritional Diagnosis:  NB-1.1 Food and nutrition-related knowledge deficit As related to Obesity.  As evidenced by BMI 45.    Intervention:  Nutrition and weight loss education provided on My Plate, CHO counting, meal planning, portion sizes, timing of meals, avoiding snacks between meals, taking medications as prescribed, benefits of exercising 60 minutes per day and prevention of DM. Healthy weight  loss tips.   Goals:  Look at getting a different band for your watch so you can track your sleep.  On your days off, get out of the house and be active. Consider trying disc golf, fishing, or hiking.  Talk to your doctor about getting a sleep study to see if you have OSA.  Eat a bigger breakfast including a good balance of starch and protein. Consider making your shakes again.   Teaching Method Utilized:  Visual Auditory Hands on  Handouts given during visit include:  The Plate Method   Meal Plan Card  Weight loss tips  Barriers to learning/adherence to lifestyle change: Work schedule  Demonstrated degree of understanding via:  Teach Back   Monitoring/Evaluation:  Dietary intake, exercise, and body weight in 2   month(s)

## 2020-08-28 NOTE — Patient Instructions (Signed)
Look at getting a different band for your watch so you can track your sleep.  On your days off, get out of the house and be active. Consider trying disc golf, fishing, or hiking.  Talk to your doctor about getting a sleep study to see if you have OSA.  Eat a bigger breakfast including a good balance of starch and protein. Consider making your shakes again.

## 2020-10-23 ENCOUNTER — Ambulatory Visit: Payer: 59 | Admitting: Dietician

## 2020-11-05 ENCOUNTER — Other Ambulatory Visit: Payer: Self-pay | Admitting: Internal Medicine

## 2020-11-05 DIAGNOSIS — G4700 Insomnia, unspecified: Secondary | ICD-10-CM

## 2020-11-09 ENCOUNTER — Other Ambulatory Visit: Payer: Self-pay | Admitting: Internal Medicine

## 2020-11-09 NOTE — Telephone Encounter (Signed)
Please refill sleep aid if appropriate to continue

## 2020-11-13 ENCOUNTER — Encounter: Payer: Self-pay | Admitting: Dietician

## 2020-11-13 ENCOUNTER — Encounter: Payer: 59 | Attending: Internal Medicine | Admitting: Dietician

## 2020-11-13 ENCOUNTER — Other Ambulatory Visit: Payer: Self-pay

## 2020-11-13 VITALS — Ht 72.0 in | Wt 326.4 lb

## 2020-11-13 DIAGNOSIS — E669 Obesity, unspecified: Secondary | ICD-10-CM | POA: Diagnosis not present

## 2020-11-13 NOTE — Progress Notes (Signed)
Medical Nutrition Therapy:  Appt start time: 0900 end time:  0930   Assessment:  Primary concerns today: Obesity.   Pt reports working 7 14 hour work weeks, but gets every other week off. Pt reports their job will be hiring someone soon and they will be able go back to their normal schedule of 4 10 hr shifts. 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 Pt reports buying premade salads and freshly sliced deli meats and cheeses for meals at work. Pt reports no troubles buying food anymore. Pt states if they take their trazadone they sleep well and will not be groggy when they wake up. Pt has been avoiding regular milk and cheese. Pt reports not drinking soda anymore, will have pepsi zero occasionally if it's in the house. Pt reports using Mio water flavoring. Pt drinks a 64 oz water container 2x a day. Pt reports keeping snacks at work during long days, and only has 15-30 minutes to eat lunch. Pt has requested a sleep study for OSA from their PCP. Pt bought new band for watch and is tracking sleep. States they get choppy sleep, sometimes good and sometimes bad. Pt reports being more active outdoors, still walking with their wife.    Wt Readings from Last 3 Encounters:  11/13/20 (!) 326 lb 6.4 oz (148.1 kg)  08/28/20 (!) 334 lb 3.2 oz (151.6 kg)  06/26/20 (!) 333 lb 6.4 oz (151.2 kg)   Ht Readings from Last 3 Encounters:  11/13/20 6' (1.829 m)  08/28/20 6' (1.829 m)  06/26/20 6' (1.829 m)    Body mass index is 44.27 kg/m.  Facility age limit for growth percentiles is 20 years. Facility age limit for growth percentiles is 20 years.   CMP Latest Ref Rng & Units 03/20/2020 03/14/2019 02/08/2019  Glucose 65 - 99 mg/dL 88 87 947(M)  BUN 6 - 20 mg/dL 12 10 14   Creatinine 0.76 - 1.27 mg/dL 5.46 5.03  Sodium 134 - 144 mmol/L 141 142 141  Potassium 3.5 - 5.2 mmol/L 4.0 4.6 3.6  Chloride 96 - 106 mmol/L 102 101 107  CO2 20 - 29 mmol/L 23 25 24    Calcium 8.7 - 10.2 mg/dL 9.6 9.2 5.46)  Total Protein 6.0 - 8.5 g/dL 7.2 7.2 -  Total Bilirubin 0.0 - 1.2 mg/dL 0.3 0.4 -  Alkaline Phos 48 - 121 IU/L 61 52 -  AST 0 - 40 IU/L 41(H) 37 -  ALT 0 - 44 IU/L 53(H) 63(H) -   Lipid Panel     Component Value Date/Time   CHOL 187 03/20/2020 1101   TRIG 421 (H) 03/20/2020 1101   HDL 42 03/20/2020 1101   CHOLHDL 4.5 03/20/2020 1101   LDLCALC 78 03/20/2020 1101   LABVLDL 67 (H) 03/20/2020 1101    Preferred Learning Style:   No preference indicated   Learning Readiness:  Ready  Change in progress   MEDICATIONS:  Probiotic, Multivitamin  DIETARY INTAKE:   3 meals a day  24-hr recall:  B (9:00 AM): Cheerios with stevia and lactaid milk  Snk ( AM):   L (1:30 PM): 2 pieces of Marco's pepperoni pizza, Pepsi zero  Snk ( PM):  D ( PM): 2 pieces of Marco's pepperoni pizza, mio water Snk ( PM):  Beverages: mio water, pepsi zero, water  Usual physical activity: walks some.  Estimated energy needs: 1800-2000 calories 200 g carbohydrates 135 g protein 50 g fat  Progress Towards Goal(s):  In progress.   Nutritional Diagnosis:  NB-1.1 Food and nutrition-related knowledge deficit As related to Obesity.  As evidenced by BMI 45.    Intervention:  Nutrition and weight loss education provided on My Plate, CHO counting, meal planning, portion sizes, timing of meals, avoiding snacks between meals, taking medications as prescribed, benefits of exercising 60 minutes per day and prevention of DM. Healthy weight loss tips.   Goals:  Consider switching to a [employer]" or adding 1-2 boiled eggs to your regular [employer] to increase your protein at breakfast easily.  Continue to be active outdoors, walk around [facility] and consider playing disc golf for activity.  Follow up with your doctor about scheduling a sleep study.   Teaching Method Utilized:  Visual Auditory Hands on  Handouts given during visit  include:  The Plate Method   Meal Plan Card  Weight loss tips  Barriers to learning/adherence to lifestyle change: Work schedule  Demonstrated degree of understanding via:  Teach Back   Monitoring/Evaluation:  Dietary intake, exercise, and body weight in 3   month(s)

## 2020-11-13 NOTE — Patient Instructions (Signed)
Consider switching to a [employer]" or adding 1-2 boiled eggs to your regular [employer] to increase your protein at breakfast easily.  Continue to be active outdoors, walk around [facility] and consider playing disc golf for activity.  Follow up with your doctor about scheduling a sleep study.

## 2021-01-05 ENCOUNTER — Emergency Department (HOSPITAL_COMMUNITY)
Admission: EM | Admit: 2021-01-05 | Discharge: 2021-01-05 | Disposition: A | Discharge status: Home / Self Care | Payer: 59 | Attending: Emergency Medicine | Admitting: Emergency Medicine

## 2021-01-05 ENCOUNTER — Encounter (HOSPITAL_COMMUNITY): Payer: Self-pay

## 2021-01-05 ENCOUNTER — Other Ambulatory Visit: Payer: Self-pay

## 2021-01-05 DIAGNOSIS — R030 Elevated blood-pressure reading, without diagnosis of hypertension: Secondary | ICD-10-CM

## 2021-01-05 DIAGNOSIS — R195 Other fecal abnormalities: Secondary | ICD-10-CM | POA: Diagnosis present

## 2021-01-05 DIAGNOSIS — J45909 Unspecified asthma, uncomplicated: Secondary | ICD-10-CM | POA: Insufficient documentation

## 2021-01-05 DIAGNOSIS — K921 Melena: Secondary | ICD-10-CM

## 2021-01-05 DIAGNOSIS — F17220 Nicotine dependence, chewing tobacco, uncomplicated: Secondary | ICD-10-CM | POA: Diagnosis not present

## 2021-01-05 DIAGNOSIS — Z7951 Long term (current) use of inhaled steroids: Secondary | ICD-10-CM | POA: Insufficient documentation

## 2021-01-05 DIAGNOSIS — R1010 Upper abdominal pain, unspecified: Secondary | ICD-10-CM | POA: Diagnosis not present

## 2021-01-05 LAB — CBC WITH DIFFERENTIAL/PLATELET
Abs Immature Granulocytes: 0.03 10*3/uL (ref 0.00–0.07)
Basophils Absolute: 0.1 10*3/uL (ref 0.0–0.1)
Basophils Relative: 1 %
Eosinophils Absolute: 0.2 10*3/uL (ref 0.0–0.5)
Eosinophils Relative: 2 %
HCT: 45.1 % (ref 39.0–52.0)
Hemoglobin: 15.1 g/dL (ref 13.0–17.0)
Immature Granulocytes: 0 %
Lymphocytes Relative: 23 %
Lymphs Abs: 1.9 10*3/uL (ref 0.7–4.0)
MCH: 30.3 pg (ref 26.0–34.0)
MCHC: 33.5 g/dL (ref 30.0–36.0)
MCV: 90.6 fL (ref 80.0–100.0)
Monocytes Absolute: 0.8 10*3/uL (ref 0.1–1.0)
Monocytes Relative: 10 %
Neutro Abs: 5.4 10*3/uL (ref 1.7–7.7)
Neutrophils Relative %: 64 %
Platelets: 257 10*3/uL (ref 150–400)
RBC: 4.98 MIL/uL (ref 4.22–5.81)
RDW: 13 % (ref 11.5–15.5)
WBC: 8.4 10*3/uL (ref 4.0–10.5)
nRBC: 0 % (ref 0.0–0.2)

## 2021-01-05 LAB — URINALYSIS, ROUTINE W REFLEX MICROSCOPIC
Bilirubin Urine: NEGATIVE
Glucose, UA: NEGATIVE mg/dL
Hgb urine dipstick: NEGATIVE
Ketones, ur: NEGATIVE mg/dL
Leukocytes,Ua: NEGATIVE
Nitrite: NEGATIVE
Protein, ur: NEGATIVE mg/dL
Specific Gravity, Urine: 1.032 — ABNORMAL HIGH (ref 1.005–1.030)
pH: 5 (ref 5.0–8.0)

## 2021-01-05 LAB — POC OCCULT BLOOD, ED: Fecal Occult Bld: NEGATIVE

## 2021-01-05 LAB — COMPREHENSIVE METABOLIC PANEL
ALT: 38 U/L (ref 0–44)
AST: 25 U/L (ref 15–41)
Albumin: 3.9 g/dL (ref 3.5–5.0)
Alkaline Phosphatase: 53 U/L (ref 38–126)
Anion gap: 8 (ref 5–15)
BUN: 17 mg/dL (ref 6–20)
CO2: 25 mmol/L (ref 22–32)
Calcium: 8.8 mg/dL — ABNORMAL LOW (ref 8.9–10.3)
Chloride: 104 mmol/L (ref 98–111)
Creatinine, Ser: 1.07 mg/dL (ref 0.61–1.24)
GFR, Estimated: >60 mL/min (ref >60)
Glucose, Bld: 111 mg/dL — ABNORMAL HIGH (ref 70–99)
Potassium: 4.1 mmol/L (ref 3.5–5.1)
Sodium: 137 mmol/L (ref 135–145)
Total Bilirubin: 0.5 mg/dL (ref 0.3–1.2)
Total Protein: 7.4 g/dL (ref 6.5–8.1)

## 2021-01-05 LAB — LIPASE, BLOOD: Lipase: 25 U/L (ref 11–51)

## 2021-01-05 NOTE — ED Provider Notes (Signed)
[facility]-EMERGENCY DEPT Provider Note   CSN: [id number] Arrival date & time: 01/05/21  5701     History Chief Complaint  Patient presents with  . Blood In Stools    Derek Fernandez is a 30 y.o. male history of obesity otherwise healthy no daily medication use.  Patient arrives to the ER for evaluation of bright red blood in the stool x1 this morning, denies similar in the past.  Reports this was a painless bowel movement.  Additionally patient reports he has noticed intermittent upper abdominal pain for the past 2 weeks aching mild nonradiating no clear aggravating or alleviating factors. No abdominal pain over the past few days.  Denies fever/chills, fall/injury, chest pain/shortness of breath, nausea/vomiting, diarrhea, dysuria/hematuria or any additional concerns.  HPI     Past Medical History:  Diagnosis Date  . Personal history of asthma     There are no problems to display for this patient.   Past Surgical History:  Procedure Laterality Date  . TONSILLECTOMY         Family History  Problem Relation Age of Onset  . Migraines Father        severe & give stroke like symptoms  . Diabetes Paternal Grandmother   . Heart disease Paternal Grandmother   . Diabetes Paternal Grandfather   . Heart disease Paternal Grandfather     Social History   Tobacco Use  . Smoking status: Never Smoker  . Smokeless tobacco: Current User    Types: Snuff  Substance Use Topics  . Alcohol use: Yes    Comment: Occassional  . Drug use: Never    Home Medications Prior to Admission medications   Medication Sig Start Date End Date Taking? Authorizing Provider  fluticasone (FLONASE) 50 MCG/ACT nasal spray Place 2 sprays into both nostrils daily. 05/20/20   Arvilla Market, DO  Multiple Vitamins-Minerals (ONE-A-DAY MENS HEALTH FORMULA PO) Take 1 tablet by mouth daily.    [provider]  Saccharomyces boulardii (PROBIOTIC) 250 MG CAPS  Take 1 capsule by mouth daily.    [provider]  traZODone (DESYREL) 50 MG tablet TAKE 1/2 TO 1 TABLET(25 TO 50 MG) BY MOUTH AT BEDTIME AS NEEDED FOR SLEEP 11/12/20   Arvilla Market, DO    Allergies    Patient has no known allergies.  Review of Systems   Review of Systems Ten systems are reviewed and are negative for acute change except as noted in the HPI  Physical Exam Updated Vital Signs BP (!) 188/98   Pulse 64   Temp 98.1 F (36.7 C) (Oral)   Resp 17   Ht 6' (1.829 m)   Wt (!) 147.4 kg   SpO2 97%   BMI 44.08 kg/m   Physical Exam Constitutional:      General: He is not in acute distress.    Appearance: Normal appearance. He is well-developed. He is not ill-appearing or diaphoretic.  HENT:     Head: Normocephalic and atraumatic.  Eyes:     General: Vision grossly intact. Gaze aligned appropriately.     Pupils: Pupils are equal, round, and reactive to light.  Neck:     Trachea: Trachea and phonation normal.  Cardiovascular:     Pulses:          Dorsalis pedis pulses are 2+ on the right side and 2+ on the left side.  Pulmonary:     Effort: Pulmonary effort is normal. No respiratory distress.  Abdominal:  General: There is no distension.     Palpations: Abdomen is soft.     Tenderness: There is no abdominal tenderness. There is no guarding or rebound.  Genitourinary:    Comments: Rectal exam chaperoned by Verner Mould.  No external hemorrhoids or other abnormalities.  Normal rectal tone.  No palpable internal hemorrhoids.  Light brown stool.  No gross blood. Musculoskeletal:        General: Normal range of motion.     Cervical back: Normal range of motion.     Right lower leg: No edema.     Left lower leg: No edema.  Feet:     Right foot:     Protective Sensation: 3 sites tested. 3 sites sensed.     Left foot:     Protective Sensation: 3 sites tested. 3 sites sensed.  Skin:    General: Skin is warm and dry.  Neurological:     Mental  Status: He is alert.     GCS: GCS eye subscore is 4. GCS verbal subscore is 5. GCS motor subscore is 6.     Comments: Speech is clear and goal oriented, follows commands Major Cranial nerves without deficit, no facial droop Moves extremities without ataxia, coordination intact  Psychiatric:        Behavior: Behavior normal.     ED Results / Procedures / Treatments   Labs (all labs ordered are listed, but only abnormal results are displayed) Labs Reviewed  COMPREHENSIVE METABOLIC PANEL - Abnormal; Notable for the following components:      Result Value   Glucose, Bld 111 (*)    Calcium 8.8 (*)    All other components within normal limits  URINALYSIS, ROUTINE W REFLEX MICROSCOPIC - Abnormal; Notable for the following components:   Specific Gravity, Urine 1.032 (*)    All other components within normal limits  CBC WITH DIFFERENTIAL/PLATELET  LIPASE, BLOOD  POC OCCULT BLOOD, ED    EKG None  Radiology No results found.  Procedures Procedures   Medications Ordered in ED Medications - No data to display  ED Course  I have reviewed the triage vital signs and the nursing notes.  Pertinent labs & imaging results that were available during my care of the patient were reviewed by me and considered in my medical decision making (see chart for details).  Clinical Course as of 01/05/21 1009  Tue Jan 05, 2021  1610 Duwayne Heck RN [BM]    Clinical Course User Index [BM] Elizabeth Palau   MDM Rules/Calculators/A&P                          Additional history obtained from: 1. Nursing notes from this visit. 2. Review of electronic medical records. ------------------- I ordered, reviewed and interpreted labs which include: Hemoccult negative. Lipase within normal limits. Urinalysis without hemoglobin or evidence of infection. CBC within normal limits, no anemia, leukocytosis or thrombocytopenia. CMP shows mild hyperglycemia of 111, mild hypocalcemia of 8.8.  No  emergent electrolyte derangement, AKI, LFT elevations or gap - Patient reports he has had 2 bowel movements while in the emergency department both times without blood in the stool.  No clear cause for patient's reported BRBPR.  Reassuring work-up here today and patient reports no further BRBPR on BM here x 2.  On reassessment no abdominal tenderness.  Low suspicion for diverticulitis, appendicitis, SBO or other emergent intra-abdominal pathologies at this time.  No indication for CT imaging  or further ER work-up.  Patient will be referred to his PCP for further evaluation and per his request we will also give GI referral to on-call specialist [facility] gastroenterology.  Incidentally patient noted to have an elevated blood pressure while in the ED, patient was informed of this and encouraged to have this rechecked by his PCP at his follow-up visit this week.  Patient asymptomatic regarding elevated blood pressure, patient informed of signs/symptoms hypertensive urgency/emergency and to return to the ER if they occur  At this time there does not appear to be any evidence of an acute emergency medical condition and the patient appears stable for discharge with appropriate outpatient follow up. Diagnosis was discussed with patient who verbalizes understanding of care plan and is agreeable to discharge. I have discussed return precautions with patient  who verbalizes understanding. Patient encouraged to follow-up with their PCP and GI. All questions answered.  Patient's case discussed with Dr. Rhunette Croft who agrees with plan to discharge with follow-up.   Note: Portions of this report may have been transcribed using voice recognition software. Every effort was made to ensure accuracy; however, inadvertent computerized transcription errors may still be present.  Final Clinical Impression(s) / ED Diagnoses Final diagnoses:  Blood in stool  Elevated blood pressure reading    Rx / DC Orders ED Discharge Orders     None       Elizabeth Palau 01/05/21 1009    Derwood Kaplan, MD 01/06/21 1652

## 2021-01-05 NOTE — ED Notes (Signed)
Pt ambulated to restroom and states he had another bowel movement but did not notice any blood in his stool, just a small amount when he wiped

## 2021-01-05 NOTE — Discharge Instructions (Addendum)
At this time there does not appear to be the presence of an emergent medical condition, however there is always the potential for conditions to change. Please read and follow the below instructions.  Please return to the Emergency Department immediately for any new or worsening symptoms. Please be sure to follow up with your Primary Care Provider within one week regarding your visit today; please call their office to schedule an appointment even if you are feeling better for a follow-up visit. You may call the specialist at [facility] gastroenterology for further evaluation treatment Your blood pressure was elevated in the emergency department today.  Please have your blood pressure rechecked by your primary care provider at your follow-up appointment and discuss medication management if indicated at that time.  Go to the nearest Emergency Department immediately if: You have fever or chills Your bleeding does not stop. You feel dizzy or you pass out (faint). You feel weak. You have very bad cramps in your back or belly. You pass large clumps of blood (clots) in your poop. Your symptoms are getting worse. You have chest pain or fast heartbeats. Get a very bad headache. Start to feel mixed up (confused). Feel weak or numb. Feel faint. Have very bad pain in your: Chest. Belly (abdomen). Throw up more than once. Have trouble breathing. You have any new/concerning or worsening of symptoms.   Please read the additional information packets attached to your discharge summary.  Do not take your medicine if  develop an itchy rash, swelling in your mouth or lips, or difficulty breathing; call 911 and seek immediate emergency medical attention if this occurs.  You may review your lab tests and imaging results in their entirety on your MyChart account.  Please discuss all results of fully with your primary care provider and other specialist at your follow-up visit.  Note: Portions of this text may  have been transcribed using voice recognition software. Every effort was made to ensure accuracy; however, inadvertent computerized transcription errors may still be present.

## 2021-01-05 NOTE — ED Notes (Signed)
Patient stated that he has had 2 BM he this morning and there has not no more bleeding in those BMs.

## 2021-01-05 NOTE — ED Triage Notes (Signed)
Pt reports 1 episode of bright red stool when using the restroom this morning. States it was when he wiped as well as in the toilet

## 2021-01-28 ENCOUNTER — Other Ambulatory Visit: Payer: Self-pay | Admitting: Physician Assistant

## 2021-01-28 DIAGNOSIS — R1011 Right upper quadrant pain: Secondary | ICD-10-CM

## 2021-02-16 ENCOUNTER — Encounter (HOSPITAL_COMMUNITY): Payer: Self-pay | Admitting: *Deleted

## 2021-02-16 ENCOUNTER — Emergency Department (HOSPITAL_COMMUNITY)
Admission: EM | Admit: 2021-02-16 | Discharge: 2021-02-16 | Disposition: A | Discharge status: Home / Self Care | Payer: 59 | Attending: Emergency Medicine | Admitting: Emergency Medicine

## 2021-02-16 ENCOUNTER — Other Ambulatory Visit: Payer: Self-pay

## 2021-02-16 ENCOUNTER — Emergency Department (HOSPITAL_COMMUNITY): Payer: 59

## 2021-02-16 DIAGNOSIS — Z7951 Long term (current) use of inhaled steroids: Secondary | ICD-10-CM | POA: Insufficient documentation

## 2021-02-16 DIAGNOSIS — R1011 Right upper quadrant pain: Secondary | ICD-10-CM | POA: Insufficient documentation

## 2021-02-16 DIAGNOSIS — F17220 Nicotine dependence, chewing tobacco, uncomplicated: Secondary | ICD-10-CM | POA: Diagnosis not present

## 2021-02-16 DIAGNOSIS — J45909 Unspecified asthma, uncomplicated: Secondary | ICD-10-CM | POA: Diagnosis not present

## 2021-02-16 LAB — CBC WITH DIFFERENTIAL/PLATELET
Abs Immature Granulocytes: 0.05 10*3/uL (ref 0.00–0.07)
Basophils Absolute: 0.1 10*3/uL (ref 0.0–0.1)
Basophils Relative: 1 %
Eosinophils Absolute: 0.2 10*3/uL (ref 0.0–0.5)
Eosinophils Relative: 2 %
HCT: 47 % (ref 39.0–52.0)
Hemoglobin: 15.9 g/dL (ref 13.0–17.0)
Immature Granulocytes: 1 %
Lymphocytes Relative: 21 %
Lymphs Abs: 2.1 10*3/uL (ref 0.7–4.0)
MCH: 30.5 pg (ref 26.0–34.0)
MCHC: 33.8 g/dL (ref 30.0–36.0)
MCV: 90 fL (ref 80.0–100.0)
Monocytes Absolute: 0.9 10*3/uL (ref 0.1–1.0)
Monocytes Relative: 9 %
Neutro Abs: 6.8 10*3/uL (ref 1.7–7.7)
Neutrophils Relative %: 66 %
Platelets: 280 10*3/uL (ref 150–400)
RBC: 5.22 MIL/uL (ref 4.22–5.81)
RDW: 13.2 % (ref 11.5–15.5)
WBC: 10 10*3/uL (ref 4.0–10.5)
nRBC: 0 % (ref 0.0–0.2)

## 2021-02-16 LAB — COMPREHENSIVE METABOLIC PANEL
ALT: 43 U/L (ref 0–44)
AST: 28 U/L (ref 15–41)
Albumin: 4.5 g/dL (ref 3.5–5.0)
Alkaline Phosphatase: 56 U/L (ref 38–126)
Anion gap: 8 (ref 5–15)
BUN: 14 mg/dL (ref 6–20)
CO2: 27 mmol/L (ref 22–32)
Calcium: 9.7 mg/dL (ref 8.9–10.3)
Chloride: 106 mmol/L (ref 98–111)
Creatinine, Ser: 0.94 mg/dL (ref 0.61–1.24)
GFR, Estimated: >60 mL/min (ref >60)
Glucose, Bld: 96 mg/dL (ref 70–99)
Potassium: 3.9 mmol/L (ref 3.5–5.1)
Sodium: 141 mmol/L (ref 135–145)
Total Bilirubin: 0.7 mg/dL (ref 0.3–1.2)
Total Protein: 8.2 g/dL — ABNORMAL HIGH (ref 6.5–8.1)

## 2021-02-16 LAB — LIPASE, BLOOD: Lipase: 25 U/L (ref 11–51)

## 2021-02-16 IMAGING — US US ABDOMEN COMPLETE
1 series · 15 of 25 positions shown · non-contrast
Comparison: [DATE] and abdominal CT [DATE]

CLINICAL DATA: 29-year-old with right upper quadrant pain.

EXAM:
ABDOMEN ULTRASOUND COMPLETE

[Series 1: us abdomen complete mc & wl · 15 of 61 slices shown]
[im 1/61]
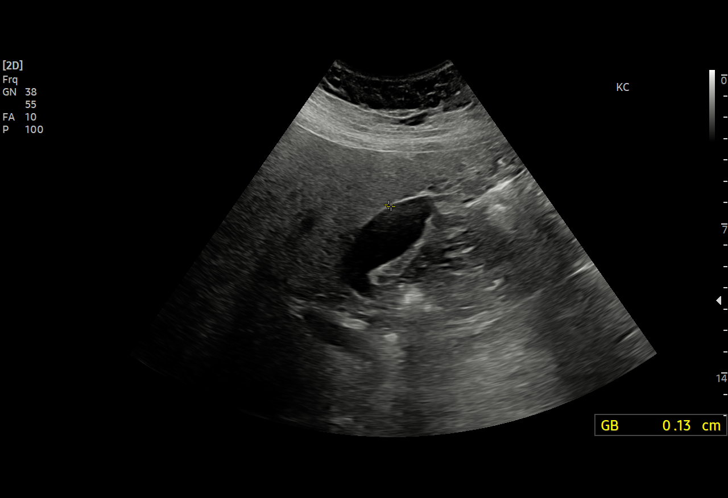
[im 6/61]
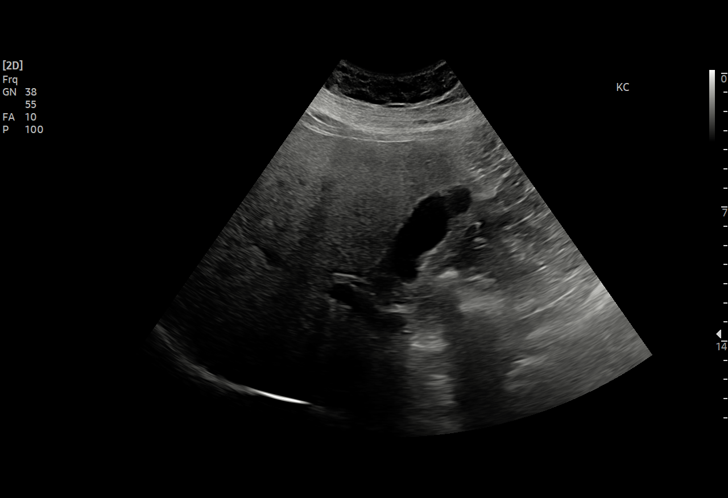
[im 11/61]
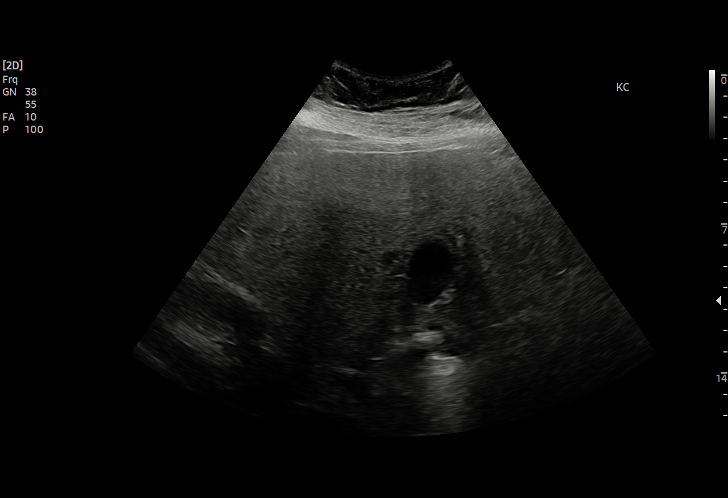
[im 13/61]
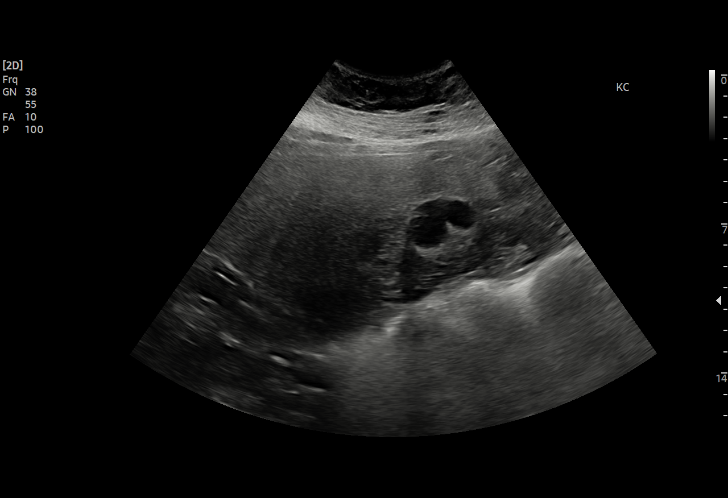
[im 18/61]
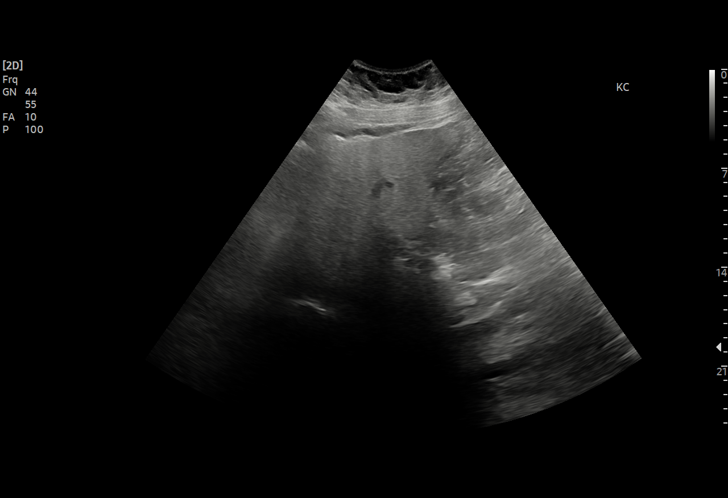
[im 23/61]
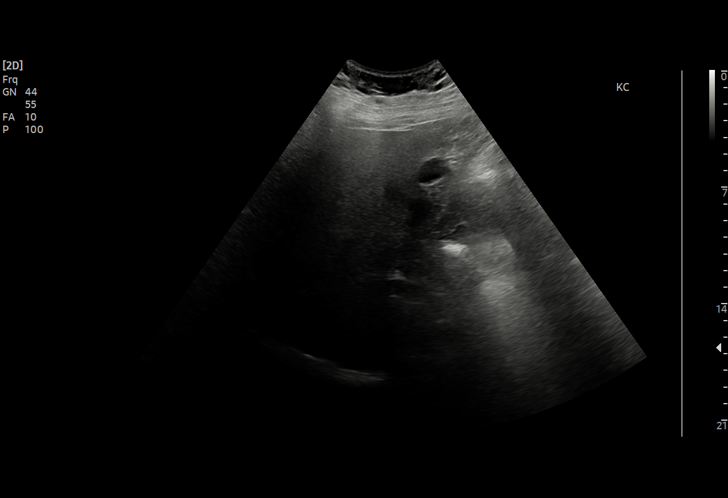
[im 26/61]
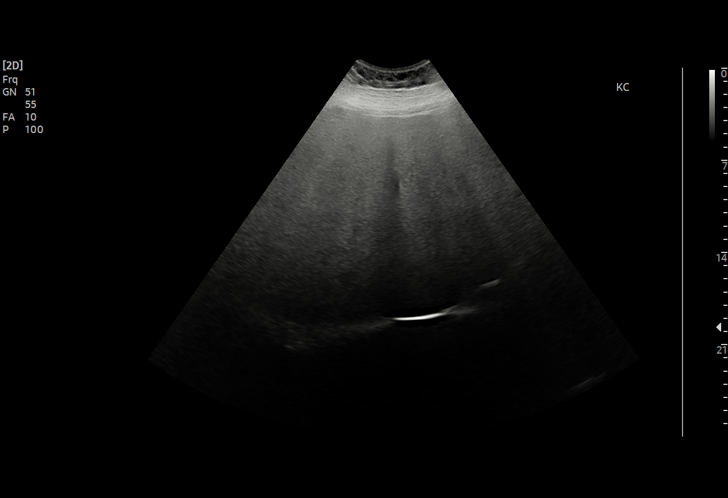
[im 31/61]
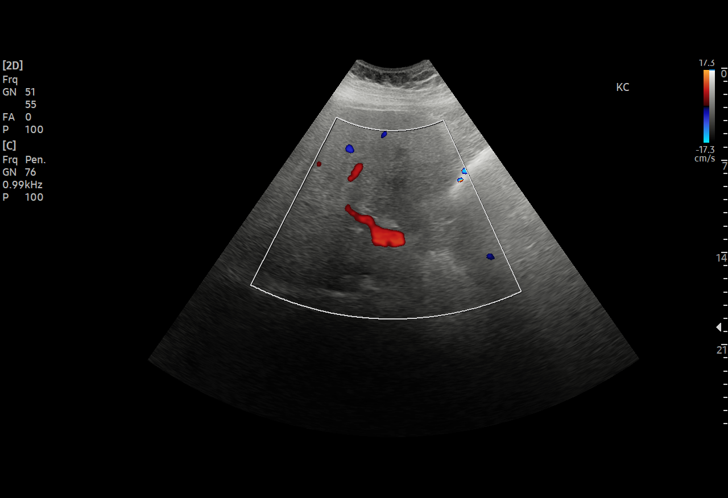
[im 36/61]
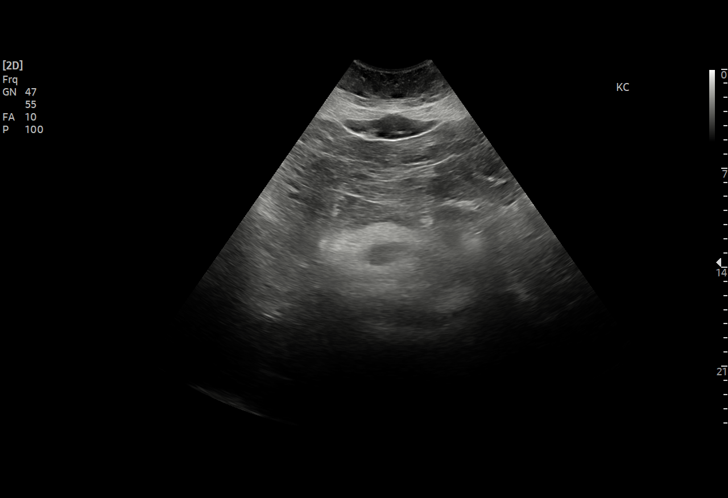
[im 38/61]
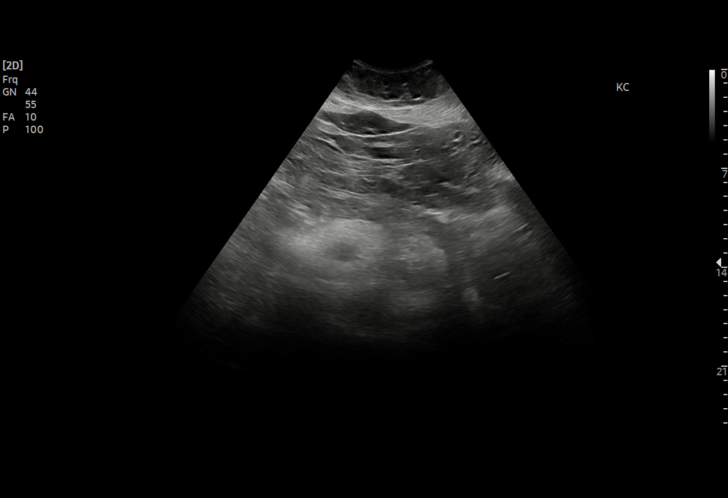
[im 43/61]
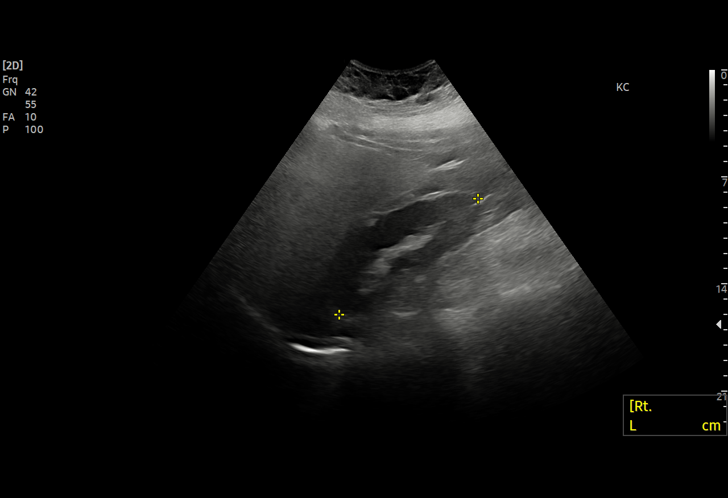
[im 48/61]
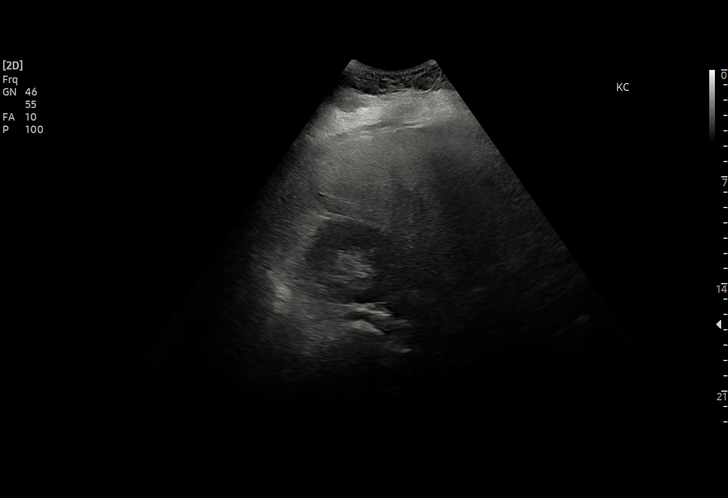
[im 51/61]
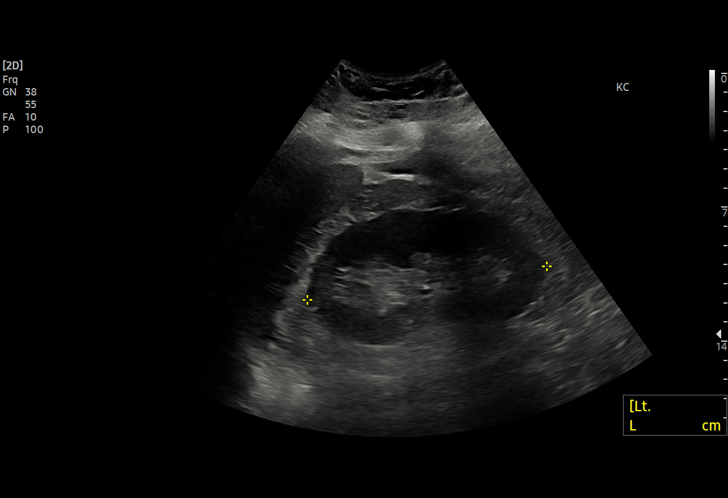
[im 56/61]
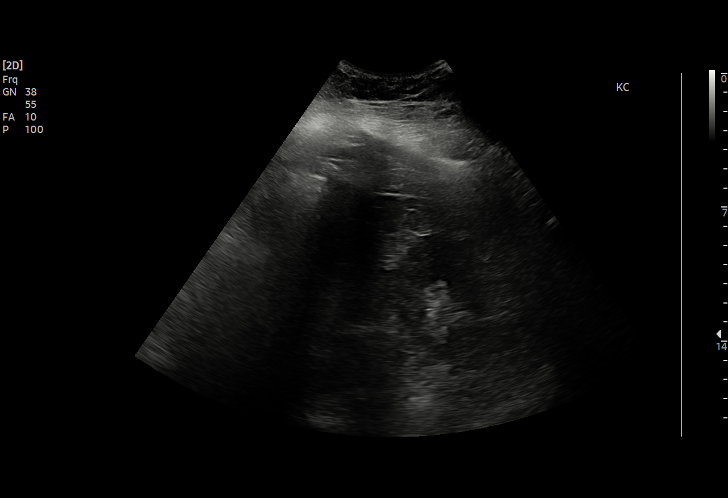
[im 61/61]
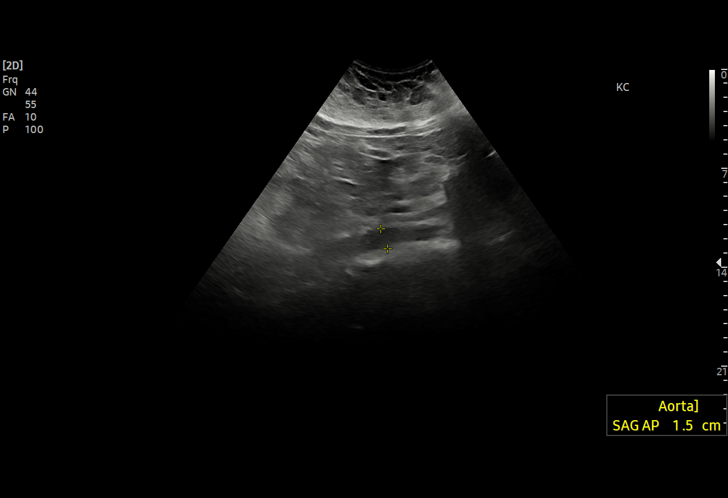

[15 of 25 positions shown; findings below may reference images not displayed]

FINDINGS: Gallbladder: Normal appearance of the gallbladder without
distention, wall thickening or stones.

Common bile duct: Diameter: 0.5 cm

Liver: No focal lesion. Liver is heterogeneous and echogenic. Portal
vein is patent on color Doppler imaging with normal direction of
blood flow towards the liver.

IVC: No abnormality visualized.

Pancreas: Visualized portion unremarkable.

Spleen: Size and appearance within normal limits.

Right Kidney: Length: 11.8 cm. Echogenicity within normal limits. No
mass or hydronephrosis visualized.

Left Kidney: Length: 12.6 cm. Echogenicity within normal limits. No
mass or hydronephrosis visualized.

Abdominal aorta: No aneurysm visualized.

Other findings: None.
IMPRESSION: 1. Normal appearance of the gallbladder without stones.
2. Liver is echogenic and heterogeneous. Findings are suggestive for
hepatic steatosis.

## 2021-02-16 MED ORDER — ONDANSETRON 4 MG PO TBDP: 4.0000 mg | ORAL_TABLET | Freq: Three times a day (TID) | ORAL | 0 refills | Status: DC | PRN

## 2021-02-16 MED ORDER — HYDROCODONE-ACETAMINOPHEN 5-325 MG PO TABS: 2.0000 | ORAL_TABLET | Freq: Four times a day (QID) | ORAL | 0 refills | Status: DC | PRN

## 2021-02-16 NOTE — Discharge Instructions (Addendum)
Schedule to see your Gi doctor for recheck.  Return if any problems.

## 2021-02-16 NOTE — ED Provider Notes (Signed)
[facility]-EMERGENCY DEPT Provider Note   CSN: [id number] Arrival date & time: 02/16/21  1023     History Chief Complaint  Patient presents with   Abdominal Pain    Derek Fernandez is a 30 y.o. male.  The history is provided by the patient. No language interpreter was used.  Abdominal Pain Pain location:  RUQ Pain quality: aching   Pain radiates to:  Does not radiate Pain severity:  Moderate Onset quality:  Gradual Timing:  Constant Progression:  Worsening Chronicity:  New Relieved by:  Nothing Worsened by:  Nothing Ineffective treatments:  None tried Associated symptoms: no anorexia   Risk factors: no alcohol abuse     Pt complains of right upper abdominal pain.  Pt reports he is seeing [facility].  He is scheduled for an abdominal ultrasound tomorrow.    Past Medical History:  Diagnosis Date   Personal history of asthma     There are no problems to display for this patient.   Past Surgical History:  Procedure Laterality Date   TONSILLECTOMY         Family History  Problem Relation Age of Onset   Migraines Father        severe & give stroke like symptoms   Diabetes Paternal Grandmother    Heart disease Paternal Grandmother    Diabetes Paternal Grandfather    Heart disease Paternal Grandfather     Social History   Tobacco Use   Smoking status: Never   Smokeless tobacco: Current    Types: Snuff  Substance Use Topics   Alcohol use: Yes    Comment: Occassional   Drug use: Never    Home Medications Prior to Admission medications   Medication Sig Start Date End Date Taking? Authorizing Provider  fluticasone (FLONASE) 50 MCG/ACT nasal spray Place 2 sprays into both nostrils daily. 05/20/20   Arvilla Market, MD  Multiple Vitamins-Minerals (ONE-A-DAY MENS HEALTH FORMULA PO) Take 1 tablet by mouth daily.    [provider]  Saccharomyces boulardii (PROBIOTIC) 250 MG CAPS Take 1 capsule by mouth daily.     [provider]  traZODone (DESYREL) 50 MG tablet TAKE 1/2 TO 1 TABLET(25 TO 50 MG) BY MOUTH AT BEDTIME AS NEEDED FOR SLEEP 11/12/20   Arvilla Market, MD    Allergies    Patient has no known allergies.  Review of Systems   Review of Systems  Gastrointestinal:  Positive for abdominal pain. Negative for anorexia.  All other systems reviewed and are negative.  Physical Exam Updated Vital Signs BP (!) 151/87   Pulse 63   Temp 98.2 F (36.8 C) (Oral)   Resp 17   SpO2 97%   Physical Exam Vitals and nursing note reviewed.  Constitutional:      Appearance: He is well-developed.  HENT:     Head: Normocephalic and atraumatic.  Eyes:     Conjunctiva/sclera: Conjunctivae normal.  Cardiovascular:     Rate and Rhythm: Normal rate and regular rhythm.     Heart sounds: No murmur heard. Pulmonary:     Effort: Pulmonary effort is normal. No respiratory distress.     Breath sounds: Normal breath sounds.  Abdominal:     General: Abdomen is flat. Bowel sounds are normal. There is abdominal bruit.     Palpations: Abdomen is soft.     Tenderness: There is abdominal tenderness in the right upper quadrant.  Musculoskeletal:     Cervical back: Neck supple.  Skin:    General: Skin is warm and dry.  Neurological:     Mental Status: He is alert.    ED Results / Procedures / Treatments   Labs (all labs ordered are listed, but only abnormal results are displayed) Labs Reviewed  COMPREHENSIVE METABOLIC PANEL - Abnormal; Notable for the following components:      Result Value   Total Protein 8.2 (*)    All other components within normal limits  CBC WITH DIFFERENTIAL/PLATELET  LIPASE, BLOOD    EKG None  Radiology US Abdomen Complete  Result Date: 02/16/2021 CLINICAL DATA:  30 year old with right upper quadrant pain. EXAM: ABDOMEN ULTRASOUND COMPLETE COMPARISON:  03/24/2018 and abdominal CT 03/24/2018 FINDINGS: Gallbladder: Normal appearance of the gallbladder without  distention, wall thickening or stones. Common bile duct: Diameter: 0.5 cm Liver: No focal lesion. Liver is heterogeneous and echogenic. Portal vein is patent on color Doppler imaging with normal direction of blood flow towards the liver. IVC: No abnormality visualized. Pancreas: Visualized portion unremarkable. Spleen: Size and appearance within normal limits. Right Kidney: Length: 11.8 cm. Echogenicity within normal limits. No mass or hydronephrosis visualized. Left Kidney: Length: 12.6 cm. Echogenicity within normal limits. No mass or hydronephrosis visualized. Abdominal aorta: No aneurysm visualized. Other findings: None. IMPRESSION: 1. Normal appearance of the gallbladder without stones. 2. Liver is echogenic and heterogeneous. Findings are suggestive for hepatic steatosis. Electronically Signed   By: Richarda Overlie M.D.   On: 02/16/2021 13:28    Procedures Procedures   Medications Ordered in ED Medications - No data to display  ED Course  I have reviewed the triage vital signs and the nursing notes.  Pertinent labs & imaging results that were available during my care of the patient were reviewed by me and considered in my medical decision making (see chart for details).    MDM Rules/Calculators/A&P                          MDM:  Pt's labs reviewed, Ultrasound shows no gallstones.  Pt counseled on diet.  He is advised to call Gi tomorrow to schedule follow up  Final Clinical Impression(s) / ED Diagnoses Final diagnoses:  Right upper quadrant abdominal pain    Rx / DC Orders ED Discharge Orders          Ordered    HYDROcodone-acetaminophen (NORCO/VICODIN) 5-325 MG tablet  Every 6 hours PRN        02/16/21 1421    ondansetron (ZOFRAN ODT) 4 MG disintegrating tablet  Every 8 hours PRN        02/16/21 1421          An After Visit Summary was printed and given to the patient.    Elson Areas, [place] 02/16/21 1433    Pricilla Loveless, MD 02/16/21 [phone]

## 2021-02-16 NOTE — ED Triage Notes (Signed)
Pt complains of right sided abdominal pain for past couple weeks. Also had occasional blood in stool. Went to GI who wants to do ultrasound to check gallbladder. Pain became worse today.

## 2021-02-17 ENCOUNTER — Other Ambulatory Visit: Payer: 59

## 2021-02-19 ENCOUNTER — Encounter: Payer: 59 | Attending: Internal Medicine | Admitting: Dietician

## 2021-02-19 ENCOUNTER — Encounter: Payer: Self-pay | Admitting: Dietician

## 2021-02-19 VITALS — Ht 72.0 in | Wt 326.6 lb

## 2021-02-19 DIAGNOSIS — E669 Obesity, unspecified: Secondary | ICD-10-CM | POA: Diagnosis present

## 2021-02-19 NOTE — Progress Notes (Signed)
Medical Nutrition Therapy:  Appt start time: 0900 end time:  0930   Assessment:  Primary concerns today: Obesity.   Pt reports having upper right quadrant pain, went to the ER. No diagnosis yet, will have colonoscopy next week. Pt reports doctor suspects hepatic steatosis, or gall bladder disorder. Pt reports family history of colon polyps as well.  Pt is drinking more water due to hot days at work, carries a 48 oz water bottle that they refill multiple times a day. Pt is back to their normal work schedule of 4, 10 hour shifts. Pt reports walking the disc golf course at [facility] when they are not working, still walking at the park with their wife on Saturday. Pt states their portion control has improved. Eating less fatty foods, lowering packaged foods, and increasing vegetable intake.   Wt Readings from Last 3 Encounters:  02/19/21 (!) 326 lb 9.6 oz (148.1 kg)  01/05/21 (!) 325 lb (147.4 kg)  11/13/20 (!) 326 lb 6.4 oz (148.1 kg)   Ht Readings from Last 3 Encounters:  01/05/21 6' (1.829 m)  11/13/20 6' (1.829 m)  08/28/20 6' (1.829 m)    Body mass index is 44.29 kg/m.    CMP Latest Ref Rng & Units 02/16/2021 01/05/2021 03/20/2020  Glucose 70 - 99 mg/dL 96 952(W) 88  BUN 6 - 20 mg/dL 14 17 12   Creatinine 0.61 - 1.24 mg/dL 4.13 2.44  Sodium 135 - 145 mmol/L 141 137 141  Potassium 3.5 - 5.1 mmol/L 3.9 4.1 4.0  Chloride 98 - 111 mmol/L 106 104 102  CO2 22 - 32 mmol/L 27 25 23   Calcium 8.9 - 10.3 mg/dL 9.7 0.10) 9.6  Total Protein 6.5 - 8.1 g/dL 8.2(H) 7.4 7.2  Total Bilirubin 0.3 - 1.2 mg/dL 0.7 0.5 0.3  Alkaline Phos 38 - 126 U/L 56 53 61  AST 15 - 41 U/L 28 25 41(H)  ALT 0 - 44 U/L 43 38 53(H)   Lipid Panel     Component Value Date/Time   CHOL 187 03/20/2020 1101   TRIG 421 (H) 03/20/2020 1101   HDL 42 03/20/2020 1101   CHOLHDL 4.5 03/20/2020 1101   LDLCALC 78 03/20/2020 1101   LABVLDL 67 (H) 03/20/2020 1101    Preferred Learning Style:  No preference  indicated   Learning Readiness: Ready Change in progress   MEDICATIONS:  Probiotic, Multivitamin  DIETARY INTAKE:   3 meals a day  24-hr recall:  B (9:00 AM): 2 eggs, 1 strip of bacon, lactaid milk Snk ( AM):  Small bag of pretzel sticks, mini Kind bar L (1:30 PM): Zax[employer] cobb salad, small Sprite Snk ( PM): none D ( PM): 2 pieces of [employer] pepperoni pizza, Sierra Mist Snk ( PM): none Beverages: milk, sodas, water  Usual physical activity: walks some.  Estimated energy needs: 1800-2000 calories 200 g carbohydrates 135 g protein 50 g fat  Progress Towards Goal(s):  In progress.   Nutritional Diagnosis:  NB-1.1 Food and nutrition-related knowledge deficit As related to Obesity.  As evidenced by BMI 45.    Intervention:  Nutrition and weight loss education provided on My Plate, CHO counting, meal planning, portion sizes, timing of meals, avoiding snacks between meals, taking medications as prescribed, benefits of exercising 60 minutes per day and prevention of DM. Healthy weight loss tips.   Goals: Follow a low fiber, low residue diet until you get your colonoscopy results. Follow the guidelines provided for fatty liver, mainly a  low fat, healthy fat diet. Moderate your soda and fruit juice intake. Keep up the good work walking consistently. Call [phone] to schedule a follow up appointment.   Teaching Method Utilized:  Visual Auditory Hands on  Handouts given during visit include: The Plate Method  Meal Plan Card Weight loss tips NEW: Gallbladder Nutrition Therapy Nutrition Care Manual NEW: NAFLD Guidelines NEW: Low Fiber Food List Nutrition Care Manual  Barriers to learning/adherence to lifestyle change: Work schedule  Demonstrated degree of understanding via:  Teach Back   Monitoring/Evaluation:  Dietary intake, exercise, and body weight PRN

## 2021-02-19 NOTE — Patient Instructions (Addendum)
Follow a low fiber, low residue diet until you get your colonoscopy results.  Follow the guidelines provided for fatty liver, mainly a low fat, healthy fat diet.  Moderate your soda and fruit juice intake.  Keep up the good work walking consistently.  Call [phone] to schedule a follow up appointment.

## 2021-04-09 ENCOUNTER — Other Ambulatory Visit: Payer: Self-pay | Admitting: Physician Assistant

## 2021-04-09 DIAGNOSIS — R1011 Right upper quadrant pain: Secondary | ICD-10-CM

## 2021-04-16 ENCOUNTER — Other Ambulatory Visit: Payer: Self-pay

## 2021-04-16 ENCOUNTER — Encounter (HOSPITAL_COMMUNITY)
Admission: RE | Admit: 2021-04-16 | Discharge: 2021-04-16 | Disposition: A | Discharge status: Home / Self Care | Payer: 59 | Source: Ambulatory Visit | Attending: Physician Assistant | Admitting: Physician Assistant

## 2021-04-16 DIAGNOSIS — R1011 Right upper quadrant pain: Secondary | ICD-10-CM | POA: Insufficient documentation

## 2021-04-16 MED ORDER — TECHNETIUM TC 99M MEBROFENIN IV KIT
5.3000 | PACK | Freq: Once | INTRAVENOUS | Status: AC | PRN
  Administered 2021-04-16: 5.3 via INTRAVENOUS

## 2021-06-14 ENCOUNTER — Encounter: Payer: Self-pay | Admitting: Family Medicine

## 2021-06-14 ENCOUNTER — Telehealth: Payer: 59 | Admitting: Family Medicine

## 2021-06-14 DIAGNOSIS — R6889 Other general symptoms and signs: Secondary | ICD-10-CM

## 2021-06-14 MED ORDER — OSELTAMIVIR PHOSPHATE 75 MG PO CAPS: 75.0000 mg | ORAL_CAPSULE | Freq: Two times a day (BID) | ORAL | 0 refills | Status: AC

## 2021-06-14 NOTE — Progress Notes (Signed)
E visit for Flu like symptoms   We are sorry that you are not feeling well.  Here is how we plan to help! Based on what you have shared with me it looks like you may have a respiratory virus that may be influenza.  Influenza or "the flu" is   an infection caused by a respiratory virus. The flu virus is highly contagious and persons who did not receive their yearly flu vaccination may "catch" the flu from close contact.  We have anti-viral medications to treat the viruses that cause this infection. They are not a "cure" and only shorten the course of the infection. These prescriptions are most effective when they are given within the first 2 days of "flu" symptoms. Antiviral medication are indicated if you have a high risk of complications from the flu. You should  also consider an antiviral medication if you are in close contact with someone who is at risk. These medications can help patients avoid complications from the flu  but have side effects that you should know. Possible side effects from Tamiflu or oseltamivir include nausea, vomiting, diarrhea, dizziness, headaches, eye redness, sleep problems or other respiratory symptoms. You should not take Tamiflu if you have an allergy to oseltamivir or any to the ingredients in Tamiflu.  Based upon your symptoms and potential risk factors I have prescribed Oseltamivir (Tamiflu).  It has been sent to your designated pharmacy.  You will take one 75 mg capsule orally twice a day for the next 5 days.  ANYONE WHO HAS FLU SYMPTOMS SHOULD: Stay home. The flu is highly contagious and going out or to work exposes others! Be sure to drink plenty of fluids. Water is fine as well as fruit juices, sodas and electrolyte beverages. You may want to stay away from caffeine or alcohol. If you are nauseated, try taking small sips of liquids. How do you know if you are getting enough fluid? Your urine should be a pale yellow or almost colorless. Get rest. Taking a steamy  shower or using a humidifier may help nasal congestion and ease sore throat pain. Using a saline nasal spray works much the same way. Cough drops, hard candies and sore throat lozenges may ease your cough. Line up a caregiver. Have someone check on you regularly.   GET HELP RIGHT AWAY IF: You cannot keep down liquids or your medications. You become short of breath Your fell like you are going to pass out or loose consciousness. Your symptoms persist after you have completed your treatment plan MAKE SURE YOU  Understand these instructions. Will watch your condition. Will get help right away if you are not doing well or get worse.  Your e-visit answers were reviewed by a board certified advanced clinical practitioner to complete your personal care plan.  Depending on the condition, your plan could have included both over the counter or prescription medications.  If there is a problem please reply  once you have received a response from your provider.  Your safety is important to us.  If you have drug allergies check your prescription carefully.    You can use MyChart to ask questions about today's visit, request a non-urgent call back, or ask for a work or school excuse for 24 hours related to this e-Visit. If it has been greater than 24 hours you will need to follow up with your provider, or enter a new e-Visit to address those concerns.  You will get an e-mail in the next   two days asking about your experience.  I hope that your e-visit has been valuable and will speed your recovery. Thank you for using e-visits.  I provided 5 minutes of non face-to-face time during this encounter for chart review, medication and order placement, as well as and documentation.   

## 2021-09-09 ENCOUNTER — Encounter: Payer: 59 | Admitting: Family Medicine

## 2021-09-10 ENCOUNTER — Ambulatory Visit (INDEPENDENT_AMBULATORY_CARE_PROVIDER_SITE_OTHER): Payer: 59 | Admitting: Family Medicine

## 2021-09-10 ENCOUNTER — Other Ambulatory Visit: Payer: Self-pay

## 2021-09-10 ENCOUNTER — Encounter: Payer: Self-pay | Admitting: Family Medicine

## 2021-09-10 ENCOUNTER — Ambulatory Visit: Payer: 59 | Admitting: Family

## 2021-09-10 VITALS — BP 136/88 | HR 79 | Temp 98.2°F | Resp 16 | Ht 72.0 in | Wt 330.8 lb

## 2021-09-10 DIAGNOSIS — Z130 Encounter for screening for diseases of the blood and blood-forming organs and certain disorders involving the immune mechanism: Secondary | ICD-10-CM

## 2021-09-10 DIAGNOSIS — K219 Gastro-esophageal reflux disease without esophagitis: Secondary | ICD-10-CM | POA: Insufficient documentation

## 2021-09-10 DIAGNOSIS — Z1329 Encounter for screening for other suspected endocrine disorder: Secondary | ICD-10-CM

## 2021-09-10 DIAGNOSIS — Z13220 Encounter for screening for lipoid disorders: Secondary | ICD-10-CM | POA: Diagnosis not present

## 2021-09-10 DIAGNOSIS — Z0000 Encounter for general adult medical examination without abnormal findings: Secondary | ICD-10-CM

## 2021-09-10 DIAGNOSIS — K580 Irritable bowel syndrome with diarrhea: Secondary | ICD-10-CM | POA: Insufficient documentation

## 2021-09-10 DIAGNOSIS — Z6841 Body Mass Index (BMI) 40.0 and over, adult: Secondary | ICD-10-CM

## 2021-09-10 DIAGNOSIS — Z13228 Encounter for screening for other metabolic disorders: Secondary | ICD-10-CM

## 2021-09-10 NOTE — Progress Notes (Signed)
Patient is here for CPE. Patient would like to talk about weight loss medication  Medication  refill

## 2021-09-10 NOTE — Progress Notes (Signed)
New Patient Office Visit  Subjective:  Patient ID: Derek Fernandez, male    DOB: 09/16/1990  Age: 31 y.o. MRN: [id number]  CC:  Chief Complaint  Patient presents with   Annual Exam   Weight Loss    HPI Derek Fernandez presents for routine annual exam. Patient would also like to discuss weight loss.   Past Medical History:  Diagnosis Date   Personal history of asthma     Past Surgical History:  Procedure Laterality Date   TONSILLECTOMY      Family History  Problem Relation Age of Onset   Migraines Father        severe & give stroke like symptoms   Diabetes Paternal Grandmother    Heart disease Paternal Grandmother    Diabetes Paternal Grandfather    Heart disease Paternal Grandfather     Social History   Socioeconomic History   Marital status: Married    Spouse name: Not on file   Number of children: Not on file   Years of education: Not on file   Highest education level: Not on file  Occupational History   Not on file  Tobacco Use   Smoking status: Never   Smokeless tobacco: Current    Types: Snuff  Substance and Sexual Activity   Alcohol use: Yes    Comment: Occassional   Drug use: Never   Sexual activity: Not on file  Other Topics Concern   Not on file  Social History Narrative   Not on file   Social Determinants of Health   Financial Resource Strain: Not on file  Food Insecurity: Not on file  Transportation Needs: Not on file  Physical Activity: Not on file  Stress: Not on file  Social Connections: Not on file  Intimate Partner Violence: Not on file    ROS Review of Systems  All other systems reviewed and are negative.  Objective:   Today's Vitals: BP 136/88    Pulse 79    Temp 98.2 F (36.8 C) (Oral)    Resp 16    Ht 6' (1.829 m)    Wt (!) 330 lb 12.8 oz (150 kg)    SpO2 95%    BMI 44.86 kg/m   Physical Exam Vitals and nursing note reviewed.  Constitutional:      General: He is not in acute distress.    Appearance: He is  obese.  HENT:     Head: Normocephalic and atraumatic.     Right Ear: Tympanic membrane, ear canal and external ear normal.     Left Ear: Tympanic membrane, ear canal and external ear normal.     Nose: Nose normal.     Mouth/Throat:     Mouth: Mucous membranes are moist.     Pharynx: Oropharynx is clear.  Eyes:     Conjunctiva/sclera: Conjunctivae normal.     Pupils: Pupils are equal, round, and reactive to light.  Neck:     Thyroid: No thyromegaly.  Cardiovascular:     Rate and Rhythm: Normal rate and regular rhythm.     Heart sounds: Normal heart sounds. No murmur heard. Pulmonary:     Effort: Pulmonary effort is normal.     Breath sounds: Normal breath sounds.  Abdominal:     General: There is no distension.     Palpations: Abdomen is soft. There is no mass.     Tenderness: There is no abdominal tenderness.     Hernia: There is no hernia  in the left inguinal area or right inguinal area.  Genitourinary:    Penis: Normal and circumcised.      Testes: Normal.  Musculoskeletal:        General: Normal range of motion.     Cervical back: Normal range of motion and neck supple.     Right lower leg: No edema.     Left lower leg: No edema.  Skin:    General: Skin is warm and dry.  Neurological:     General: No focal deficit present.     Mental Status: He is alert and oriented to person, place, and time. Mental status is at baseline.  Psychiatric:        Mood and Affect: Mood normal.        Behavior: Behavior normal.    Assessment & Plan:   1. Annual physical exam Routine labs ordered - CMP14+EGFR - CBC with Differential  2. Screening for lipid disorders  - Lipid Panel  3. Screening for endocrine/metabolic/immunity disorders  - TSH - Hemoglobin A1c  4. Class 3 severe obesity due to excess calories without serious comorbidity with body mass index (BMI) of 40.0 to 44.9 in adult [facility]) Wegovy samples given - will monitor  Outpatient Encounter Medications as of  09/10/2021  Medication Sig   hyoscyamine (LEVSIN SL) 0.125 MG SL tablet 1 tablet as needed   chlorpheniramine (WAL-FINATE) 4 MG tablet 1 tablet as needed   fluticasone (FLONASE) 50 MCG/ACT nasal spray Place 2 sprays into both nostrils daily.   pantoprazole (PROTONIX) 40 MG tablet Take 40 mg by mouth daily.   traZODone (DESYREL) 50 MG tablet TAKE 1/2 TO 1 TABLET(25 TO 50 MG) BY MOUTH AT BEDTIME AS NEEDED FOR SLEEP   [DISCONTINUED] HYDROcodone-acetaminophen (NORCO/VICODIN) 5-325 MG tablet Take 2 tablets by mouth every 6 (six) hours as needed for moderate pain.   [DISCONTINUED] Multiple Vitamins-Minerals (ONE-A-DAY MENS HEALTH FORMULA PO) Take 1 tablet by mouth daily.   [DISCONTINUED] ondansetron (ZOFRAN ODT) 4 MG disintegrating tablet Take 1 tablet (4 mg total) by mouth every 8 (eight) hours as needed for nausea or vomiting.   [DISCONTINUED] Saccharomyces boulardii (PROBIOTIC) 250 MG CAPS Take 1 capsule by mouth daily.   No facility-administered encounter medications on file as of 09/10/2021.    Follow-up: Return in about 4 weeks (around 10/08/2021) for follow up.   Becky Sax, MD

## 2021-09-11 LAB — CBC WITH DIFFERENTIAL/PLATELET
Basophils Absolute: 0.1 10*3/uL (ref 0.0–0.2)
Basos: 1 %
EOS (ABSOLUTE): 0.1 10*3/uL (ref 0.0–0.4)
Eos: 2 %
Hematocrit: 45 % (ref 37.5–51.0)
Hemoglobin: 15.4 g/dL (ref 13.0–17.7)
Immature Grans (Abs): 0 10*3/uL (ref 0.0–0.1)
Immature Granulocytes: 1 %
Lymphocytes Absolute: 2.3 10*3/uL (ref 0.7–3.1)
Lymphs: 28 %
MCH: 30.3 pg (ref 26.6–33.0)
MCHC: 34.2 g/dL (ref 31.5–35.7)
MCV: 89 fL (ref 79–97)
Monocytes Absolute: 0.9 10*3/uL (ref 0.1–0.9)
Monocytes: 11 %
Neutrophils Absolute: 4.7 10*3/uL (ref 1.4–7.0)
Neutrophils: 57 %
Platelets: 263 10*3/uL (ref 150–450)
RBC: 5.08 x10E6/uL (ref 4.14–5.80)
RDW: 13 % (ref 11.6–15.4)
WBC: 8.1 10*3/uL (ref 3.4–10.8)

## 2021-09-11 LAB — CMP14+EGFR
ALT: 50 IU/L — ABNORMAL HIGH (ref 0–44)
AST: 34 IU/L (ref 0–40)
Albumin/Globulin Ratio: 1.8 (ref 1.2–2.2)
Albumin: 4.5 g/dL (ref 4.1–5.2)
Alkaline Phosphatase: 64 IU/L (ref 44–121)
BUN/Creatinine Ratio: 13 (ref 9–20)
BUN: 13 mg/dL (ref 6–20)
Bilirubin Total: 0.5 mg/dL (ref 0.0–1.2)
CO2: 21 mmol/L (ref 20–29)
Calcium: 9.4 mg/dL (ref 8.7–10.2)
Chloride: 104 mmol/L (ref 96–106)
Creatinine, Ser: 0.99 mg/dL (ref 0.76–1.27)
Globulin, Total: 2.5 g/dL (ref 1.5–4.5)
Glucose: 92 mg/dL (ref 70–99)
Potassium: 4.3 mmol/L (ref 3.5–5.2)
Sodium: 142 mmol/L (ref 134–144)
Total Protein: 7 g/dL (ref 6.0–8.5)
eGFR: 106 mL/min/{1.73_m2} (ref >59)

## 2021-09-11 LAB — LIPID PANEL
Chol/HDL Ratio: 3.9 ratio (ref 0.0–5.0)
Cholesterol, Total: 178 mg/dL (ref 100–199)
HDL: 46 mg/dL (ref >39)
LDL Chol Calc (NIH): 104 mg/dL — ABNORMAL HIGH (ref 0–99)
Triglycerides: 159 mg/dL — ABNORMAL HIGH (ref 0–149)
VLDL Cholesterol Cal: 28 mg/dL (ref 5–40)

## 2021-09-11 LAB — HEMOGLOBIN A1C
Est. average glucose Bld gHb Est-mCnc: 117 mg/dL
Hgb A1c MFr Bld: 5.7 % — ABNORMAL HIGH (ref 4.8–5.6)

## 2021-09-11 LAB — TSH: TSH: 1.4 u[IU]/mL (ref 0.450–4.500)

## 2021-10-15 ENCOUNTER — Encounter: Payer: Self-pay | Admitting: Family Medicine

## 2021-10-15 ENCOUNTER — Other Ambulatory Visit: Payer: Self-pay

## 2021-10-15 ENCOUNTER — Telehealth: Payer: Self-pay | Admitting: Family Medicine

## 2021-10-15 ENCOUNTER — Ambulatory Visit (INDEPENDENT_AMBULATORY_CARE_PROVIDER_SITE_OTHER): Payer: 59 | Admitting: Family Medicine

## 2021-10-15 VITALS — BP 118/86 | HR 79 | Temp 98.1°F | Resp 16 | Wt 321.6 lb

## 2021-10-15 DIAGNOSIS — Z6841 Body Mass Index (BMI) 40.0 and over, adult: Secondary | ICD-10-CM

## 2021-10-15 MED ORDER — WEGOVY 0.5 MG/0.5ML ~~LOC~~ SOAJ: 0.5000 mg | SUBCUTANEOUS | 0 refills | Status: DC

## 2021-10-15 NOTE — Progress Notes (Signed)
Patient is here for f/u weight check. Patient was given a sample box of wegovy. Patient has loss about 9lb.  ?

## 2021-10-15 NOTE — Telephone Encounter (Signed)
Pt called in regards to his RX for Semaglutide needing a prior authz. Unsure if the PA request had been received. ?

## 2021-10-15 NOTE — Telephone Encounter (Signed)
Spoke with patient that PA has been approved ?

## 2021-10-15 NOTE — Progress Notes (Signed)
? ?Established Patient Office Visit ? ?Subjective:  ?Patient ID: Derek Fernandez, male    DOB: 02-Jun-1991  Age: 31 y.o. MRN: [id number] ? ?CC:  ?Chief Complaint  ?Patient presents with  ? Weight Check  ? ? ?HPI ?Derek Fernandez presents for follow up of weight loss. Patient reports that he has done well with the wegovy and would like to continue. Denies acute complaints or concerns.  ? ?Past Medical History:  ?Diagnosis Date  ? Personal history of asthma   ? ? ?Past Surgical History:  ?Procedure Laterality Date  ? TONSILLECTOMY    ? ? ?Family History  ?Problem Relation Age of Onset  ? Migraines Father   ?     severe & give stroke like symptoms  ? Diabetes Paternal Grandmother   ? Heart disease Paternal Grandmother   ? Diabetes Paternal Grandfather   ? Heart disease Paternal Grandfather   ? ? ?Social History  ? ?Socioeconomic History  ? Marital status: Married  ?  Spouse name: Not on file  ? Number of children: Not on file  ? Years of education: Not on file  ? Highest education level: Not on file  ?Occupational History  ? Not on file  ?Tobacco Use  ? Smoking status: Never  ? Smokeless tobacco: Current  ?  Types: Snuff  ?Substance and Sexual Activity  ? Alcohol use: Yes  ?  Comment: Occassional  ? Drug use: Never  ? Sexual activity: Not on file  ?Other Topics Concern  ? Not on file  ?Social History Narrative  ? Not on file  ? ?Social Determinants of Health  ? ?Financial Resource Strain: Not on file  ?Food Insecurity: Not on file  ?Transportation Needs: Not on file  ?Physical Activity: Not on file  ?Stress: Not on file  ?Social Connections: Not on file  ?Intimate Partner Violence: Not on file  ? ? ?ROS ?Review of Systems  ?All other systems reviewed and are negative. ? ?Objective:  ? ?Today's Vitals: BP 118/86   Pulse 79   Temp 98.1 ?F (36.7 ?C) (Oral)   Resp 16   Wt (!) 321 lb 9.6 oz (145.9 kg)   SpO2 96%   BMI 43.62 kg/m?  ? ?Physical Exam ?Vitals and nursing note reviewed.  ?Constitutional:   ?   General:  He is not in acute distress. ?   Appearance: He is obese.  ?Cardiovascular:  ?   Rate and Rhythm: Normal rate and regular rhythm.  ?Pulmonary:  ?   Effort: Pulmonary effort is normal.  ?   Breath sounds: Normal breath sounds.  ?Abdominal:  ?   Palpations: Abdomen is soft.  ?   Tenderness: There is no abdominal tenderness.  ?Neurological:  ?   General: No focal deficit present.  ?   Mental Status: He is alert and oriented to person, place, and time.  ? ? ?Assessment & Plan:  ? ?1. Class 3 severe obesity due to excess calories without serious comorbidity with body mass index (BMI) of 40.0 to 44.9 in adult [facility]) ?Patient lost 9lbs this month. Will increase dosage to 0.5 and monitor ? ? ? ?Outpatient Encounter Medications as of 10/15/2021  ?Medication Sig  ? chlorpheniramine (CHLOR-TRIMETON) 4 MG tablet 1 tablet as needed  ? fluticasone (FLONASE) 50 MCG/ACT nasal spray Place 2 sprays into both nostrils daily.  ? hyoscyamine (LEVSIN SL) 0.125 MG SL tablet 1 tablet as needed  ? Semaglutide-Weight Management (WEGOVY) 0.5 MG/0.5ML SOAJ Inject 0.5 mg into  the skin every 7 (seven) days.  ? traZODone (DESYREL) 50 MG tablet TAKE 1/2 TO 1 TABLET(25 TO 50 MG) BY MOUTH AT BEDTIME AS NEEDED FOR SLEEP  ? famotidine (PEPCID) 20 MG tablet   ? [DISCONTINUED] pantoprazole (PROTONIX) 40 MG tablet Take 40 mg by mouth daily.  ? ?No facility-administered encounter medications on file as of 10/15/2021.  ? ? ?Follow-up: Return in about 4 weeks (around 11/12/2021) for follow up.  ? ?Tommie Raymond, MD ? ?

## 2021-10-16 ENCOUNTER — Encounter: Payer: Self-pay | Admitting: Family Medicine

## 2021-10-21 ENCOUNTER — Telehealth: Payer: Self-pay | Admitting: Family Medicine

## 2021-10-21 NOTE — Telephone Encounter (Signed)
Received a call from [facility] in regards to still needing the Prior Auth for the [employer]tr[employer]. Please contact them. ?

## 2021-10-21 NOTE — Telephone Encounter (Signed)
[facility] rep  called and I advise them we are still awaiting approval from appeal ?

## 2021-11-17 ENCOUNTER — Ambulatory Visit: Payer: 59 | Admitting: Family Medicine

## 2021-11-17 VITALS — BP 131/94 | HR 92 | Temp 98.0°F | Resp 16 | Ht 72.0 in | Wt 315.4 lb

## 2021-11-17 DIAGNOSIS — Z6841 Body Mass Index (BMI) 40.0 and over, adult: Secondary | ICD-10-CM | POA: Diagnosis not present

## 2021-11-17 MED ORDER — PHENTERMINE HCL 37.5 MG PO TABS: 37.5000 mg | ORAL_TABLET | Freq: Every day | ORAL | 0 refills | Status: DC

## 2021-11-18 ENCOUNTER — Encounter: Payer: Self-pay | Admitting: Family Medicine

## 2021-11-18 NOTE — Progress Notes (Signed)
? ?Established Patient Office Visit ? ?Subjective:  ?Patient ID: Derek Fernandez, male    DOB: 12-Aug-1991  Age: 31 y.o. MRN: [id number] ? ?CC:  ?Chief Complaint  ?Patient presents with  ? Weight Check  ? ? ?HPI ?Derek Fernandez presents for follow up of obesity with weight check. He has been losing weight steadily. He has not been able to get his insurance to authorize wegovy. He would like to know some other options.  ? ?Past Medical History:  ?Diagnosis Date  ? Personal history of asthma   ? ? ?Past Surgical History:  ?Procedure Laterality Date  ? TONSILLECTOMY    ? ? ?Family History  ?Problem Relation Age of Onset  ? Migraines Father   ?     severe & give stroke like symptoms  ? Diabetes Paternal Grandmother   ? Heart disease Paternal Grandmother   ? Diabetes Paternal Grandfather   ? Heart disease Paternal Grandfather   ? ? ?Social History  ? ?Socioeconomic History  ? Marital status: Married  ?  Spouse name: Not on file  ? Number of children: Not on file  ? Years of education: Not on file  ? Highest education level: Not on file  ?Occupational History  ? Not on file  ?Tobacco Use  ? Smoking status: Never  ? Smokeless tobacco: Current  ?  Types: Snuff  ?Substance and Sexual Activity  ? Alcohol use: Yes  ?  Comment: Occassional  ? Drug use: Never  ? Sexual activity: Not on file  ?Other Topics Concern  ? Not on file  ?Social History Narrative  ? Not on file  ? ?Social Determinants of Health  ? ?Financial Resource Strain: Not on file  ?Food Insecurity: Not on file  ?Transportation Needs: Not on file  ?Physical Activity: Not on file  ?Stress: Not on file  ?Social Connections: Not on file  ?Intimate Partner Violence: Not on file  ? ? ?ROS ?Review of Systems  ?All other systems reviewed and are negative. ? ?Objective:  ? ?Today's Vitals: BP (!) 131/94   Pulse 92   Temp 98 ?F (36.7 ?C) (Oral)   Resp 16   Ht 6' (1.829 m)   Wt (!) 315 lb 6.4 oz (143.1 kg)   SpO2 96%   BMI 42.78 kg/m?  ? ?Physical Exam ?Vitals and  nursing note reviewed.  ?Constitutional:   ?   General: He is not in acute distress. ?   Appearance: He is obese.  ?Cardiovascular:  ?   Rate and Rhythm: Normal rate and regular rhythm.  ?Pulmonary:  ?   Effort: Pulmonary effort is normal.  ?   Breath sounds: Normal breath sounds.  ?Abdominal:  ?   Palpations: Abdomen is soft.  ?   Tenderness: There is no abdominal tenderness.  ?Neurological:  ?   General: No focal deficit present.  ?   Mental Status: He is alert and oriented to person, place, and time.  ? ? ?Assessment & Plan:  ? ?1. Class 3 severe obesity due to excess calories without serious comorbidity with body mass index (BMI) of 40.0 to 44.9 in adult [facility]) ?Patient losing weight. Phentermine prescribed. monitor ? ? ? ?Outpatient Encounter Medications as of 11/17/2021  ?Medication Sig  ? phentermine (ADIPEX-P) 37.5 MG tablet Take 1 tablet (37.5 mg total) by mouth daily before breakfast.  ? chlorpheniramine (CHLOR-TRIMETON) 4 MG tablet 1 tablet as needed  ? famotidine (PEPCID) 20 MG tablet   ? fluticasone (FLONASE) 50  MCG/ACT nasal spray Place 2 sprays into both nostrils daily.  ? hyoscyamine (LEVSIN SL) 0.125 MG SL tablet 1 tablet as needed  ? Semaglutide-Weight Management (WEGOVY) 0.5 MG/0.5ML SOAJ Inject 0.5 mg into the skin every 7 (seven) days.  ? traZODone (DESYREL) 50 MG tablet TAKE 1/2 TO 1 TABLET(25 TO 50 MG) BY MOUTH AT BEDTIME AS NEEDED FOR SLEEP  ? ?No facility-administered encounter medications on file as of 11/17/2021.  ? ? ?Follow-up: No follow-ups on file.  ? ?Tommie Raymond, MD ? ?

## 2021-11-19 ENCOUNTER — Ambulatory Visit: Payer: 59 | Admitting: Family Medicine

## 2021-12-24 ENCOUNTER — Encounter: Payer: Self-pay | Admitting: Family Medicine

## 2021-12-24 ENCOUNTER — Ambulatory Visit (INDEPENDENT_AMBULATORY_CARE_PROVIDER_SITE_OTHER): Payer: 59 | Admitting: Family Medicine

## 2021-12-24 VITALS — BP 137/88 | HR 73 | Temp 98.1°F | Resp 16 | Wt 307.9 lb

## 2021-12-24 DIAGNOSIS — Z6841 Body Mass Index (BMI) 40.0 and over, adult: Secondary | ICD-10-CM

## 2021-12-24 MED ORDER — PHENTERMINE HCL 37.5 MG PO TABS: 37.5000 mg | ORAL_TABLET | Freq: Every day | ORAL | 0 refills | Status: DC

## 2021-12-24 NOTE — Progress Notes (Signed)
? ?Established  Patient Office Visit ? ?Subjective   ? ?Patient ID: Derek Fernandez, male    DOB: March 31, 1991  Age: 31 y.o. MRN: [id number] ? ?CC:  ?Chief Complaint  ?Patient presents with  ? Weight Check  ? Medication Refill  ? ? ?HPI ?Derek Fernandez presents for weight check/management. Patient reports that he is unable to get his insurance to cover wegovy. He does reports good weight loss with phentermine.  ? ? ?Outpatient Encounter Medications as of 12/24/2021  ?Medication Sig  ? chlorpheniramine (CHLOR-TRIMETON) 4 MG tablet 1 tablet as needed  ? famotidine (PEPCID) 20 MG tablet   ? fluticasone (FLONASE) 50 MCG/ACT nasal spray Place 2 sprays into both nostrils daily.  ? hyoscyamine (LEVSIN SL) 0.125 MG SL tablet 1 tablet as needed  ? phentermine (ADIPEX-P) 37.5 MG tablet Take 1 tablet (37.5 mg total) by mouth daily before breakfast.  ? traZODone (DESYREL) 50 MG tablet TAKE 1/2 TO 1 TABLET(25 TO 50 MG) BY MOUTH AT BEDTIME AS NEEDED FOR SLEEP  ? [DISCONTINUED] phentermine (ADIPEX-P) 37.5 MG tablet Take 1 tablet (37.5 mg total) by mouth daily before breakfast.  ? [DISCONTINUED] Semaglutide-Weight Management (WEGOVY) 0.5 MG/0.5ML SOAJ Inject 0.5 mg into the skin every 7 (seven) days.  ? ?No facility-administered encounter medications on file as of 12/24/2021.  ? ? ?Past Medical History:  ?Diagnosis Date  ? Personal history of asthma   ? ? ?Past Surgical History:  ?Procedure Laterality Date  ? TONSILLECTOMY    ? ? ?Family History  ?Problem Relation Age of Onset  ? Migraines Father   ?     severe & give stroke like symptoms  ? Diabetes Paternal Grandmother   ? Heart disease Paternal Grandmother   ? Diabetes Paternal Grandfather   ? Heart disease Paternal Grandfather   ? ? ?Social History  ? ?Socioeconomic History  ? Marital status: Married  ?  Spouse name: Not on file  ? Number of children: Not on file  ? Years of education: Not on file  ? Highest education level: Not on file  ?Occupational History  ? Not on file   ?Tobacco Use  ? Smoking status: Never  ? Smokeless tobacco: Current  ?  Types: Snuff  ?Substance and Sexual Activity  ? Alcohol use: Yes  ?  Comment: Occassional  ? Drug use: Never  ? Sexual activity: Not on file  ?Other Topics Concern  ? Not on file  ?Social History Narrative  ? Not on file  ? ?Social Determinants of Health  ? ?Financial Resource Strain: Not on file  ?Food Insecurity: Not on file  ?Transportation Needs: Not on file  ?Physical Activity: Not on file  ?Stress: Not on file  ?Social Connections: Not on file  ?Intimate Partner Violence: Not on file  ? ? ?Review of Systems  ?All other systems reviewed and are negative. ? ?  ? ? ?Objective   ? ?BP 137/88   Pulse 73   Temp 98.1 ?F (36.7 ?C) (Oral)   Resp 16   Wt (!) 307 lb 14.4 oz (139.7 kg)   SpO2 97%   BMI 41.76 kg/m?  ? ?Physical Exam ?Vitals and nursing note reviewed.  ?Constitutional:   ?   General: He is not in acute distress. ?   Appearance: He is obese.  ?Cardiovascular:  ?   Rate and Rhythm: Normal rate and regular rhythm.  ?Pulmonary:  ?   Effort: Pulmonary effort is normal.  ?   Breath sounds:  Normal breath sounds.  ?Abdominal:  ?   Palpations: Abdomen is soft.  ?   Tenderness: There is no abdominal tenderness.  ?Neurological:  ?   General: No focal deficit present.  ?   Mental Status: He is alert and oriented to person, place, and time.  ? ? ? ?  ? ?Assessment & Plan:  ? ?1. Class 3 severe obesity due to excess calories without serious comorbidity with body mass index (BMI) of 40.0 to 44.9 in adult [facility]) ?Patient with good weight loss. Phentermine refilled. Continue and monitor. Goal 4-6lbs/wt loss ? ? ? ?Return in about 4 weeks (around 01/21/2022) for follow up.  ? ?Tommie Raymond, MD ? ? ?

## 2021-12-24 NOTE — Progress Notes (Signed)
Patient is here for weight check and refill on  phentermine (ADIPEX-P) 37.5 MG tablet . Patient said that he feels a lot better since losing weight. ?

## 2022-01-13 ENCOUNTER — Other Ambulatory Visit: Payer: Self-pay | Admitting: Family Medicine

## 2022-01-14 NOTE — Telephone Encounter (Signed)
Requested medication (s) are due for refill today: yes  Requested medication (s) are on the active medication list: yes  Last refill:  12/24/21 #30/0  Future visit scheduled: yes  Notes to clinic:  Unable to refill per protocol, cannot delegate.    Requested Prescriptions  Pending Prescriptions Disp Refills   phentermine (ADIPEX-P) 37.5 MG tablet [Pharmacy Med Name: PHENTERMINE 37.5MG TABLETS] 30 tablet     Sig: TAKE 1 TABLET(37.5 MG) BY MOUTH DAILY BEFORE BREAKFAST     Not Delegated - Neurology: Anticonvulsants - Controlled - phentermine hydrochloride Failed - 01/13/2022  2:43 PM      Failed - This refill cannot be delegated      Passed - eGFR in normal range and within 360 days    GFR calc Af Amer  Date Value Ref Range Status  03/20/2020 126 >59 mL/min/1.73 Final    Comment:    **[facility] currently reports eGFR in compliance with the current**   recommendations of the [employer]. [facility] will   update reporting as new guidelines are published from the NKF-ASN   Task force.    GFR, Estimated  Date Value Ref Range Status  02/16/2021 >60 >60 mL/min Final    Comment:    (NOTE) Calculated using the CKD-EPI Creatinine Equation (2021)    eGFR  Date Value Ref Range Status  09/10/2021 106 >59 mL/min/1.73 Final         Passed - Cr in normal range and within 360 days    Creatinine, Ser  Date Value Ref Range Status  09/10/2021 0.99 0.76 - 1.27 mg/dL Final         Passed - Last BP in normal range    BP Readings from Last 1 Encounters:  12/24/21 137/88         Passed - Valid encounter within last 6 months    Recent Outpatient Visits           3 weeks ago Class 3 severe obesity due to excess calories without serious comorbidity with body mass index (BMI) of 40.0 to 44.9 in adult [facility])   [facility] at [facility], MD   1 month ago Class 3 severe obesity due to excess calories without serious comorbidity with body mass index (BMI) of 40.0  to 44.9 in adult [facility])   Primary Care at [facility], MD   3 months ago Class 3 severe obesity due to excess calories without serious comorbidity with body mass index (BMI) of 40.0 to 44.9 in adult [facility])   Primary Care at [facility], MD   4 months ago Annual physical exam   Primary Care at [facility], MD   1 year ago Annual physical exam   Primary Care at [facility], Bayard Beaver, MD       Future Appointments             In 2 weeks Dorna Mai, MD Primary Care at [facility] - Weight completed in the last 3 months    Wt Readings from Last 1 Encounters:  12/24/21 (!) 307 lb 14.4 oz (139.7 kg)

## 2022-01-19 ENCOUNTER — Other Ambulatory Visit: Payer: Self-pay | Admitting: Family Medicine

## 2022-01-20 ENCOUNTER — Encounter: Payer: Self-pay | Admitting: Family Medicine

## 2022-01-20 ENCOUNTER — Ambulatory Visit: Payer: 59 | Admitting: Family Medicine

## 2022-01-20 NOTE — Telephone Encounter (Signed)
Schedule appointment?

## 2022-01-21 NOTE — Telephone Encounter (Signed)
Unfortunately he will need to schedule appointment with his PCP for weight check and medication refill.

## 2022-01-31 ENCOUNTER — Encounter: Payer: Self-pay | Admitting: Family Medicine

## 2022-01-31 ENCOUNTER — Ambulatory Visit: Payer: 59 | Admitting: Family Medicine

## 2022-01-31 VITALS — BP 139/91 | HR 77 | Temp 98.1°F | Wt 305.0 lb

## 2022-01-31 DIAGNOSIS — Z7689 Persons encountering health services in other specified circumstances: Secondary | ICD-10-CM

## 2022-01-31 DIAGNOSIS — Z6841 Body Mass Index (BMI) 40.0 and over, adult: Secondary | ICD-10-CM

## 2022-01-31 MED ORDER — PHENTERMINE HCL 37.5 MG PO TABS
37.5000 mg | ORAL_TABLET | Freq: Every day | ORAL | 0 refills | Status: DC
Start: 2022-01-31 — End: 2022-03-11

## 2022-01-31 NOTE — Progress Notes (Signed)
Patient is her for weight loss check.   Patient would also like to be tested for parkinson disease, due to family history.

## 2022-01-31 NOTE — Progress Notes (Signed)
Established Patient Office Visit  Subjective    Patient ID: Derek Fernandez, male    DOB: 09-11-1990  Age: 31 y.o. MRN: [id number]  CC:  Chief Complaint  Patient presents with   Weight Check    HPI Derek Fernandez presents for follow up of weight management. Patient denies acute complaints.    Outpatient Encounter Medications as of 01/31/2022  Medication Sig   chlorpheniramine (CHLOR-TRIMETON) 4 MG tablet 1 tablet as needed   famotidine (PEPCID) 20 MG tablet    fluticasone (FLONASE) 50 MCG/ACT nasal spray Place 2 sprays into both nostrils daily.   hyoscyamine (LEVSIN SL) 0.125 MG SL tablet 1 tablet as needed   traZODone (DESYREL) 50 MG tablet TAKE 1/2 TO 1 TABLET(25 TO 50 MG) BY MOUTH AT BEDTIME AS NEEDED FOR SLEEP   [DISCONTINUED] phentermine (ADIPEX-P) 37.5 MG tablet Take 1 tablet (37.5 mg total) by mouth daily before breakfast.   phentermine (ADIPEX-P) 37.5 MG tablet Take 1 tablet (37.5 mg total) by mouth daily before breakfast.   No facility-administered encounter medications on file as of 01/31/2022.    Past Medical History:  Diagnosis Date   Personal history of asthma     Past Surgical History:  Procedure Laterality Date   TONSILLECTOMY      Family History  Problem Relation Age of Onset   Migraines Father        severe & give stroke like symptoms   Diabetes Paternal Grandmother    Heart disease Paternal Grandmother    Diabetes Paternal Grandfather    Heart disease Paternal Grandfather     Social History   Socioeconomic History   Marital status: Married    Spouse name: Not on file   Number of children: Not on file   Years of education: Not on file   Highest education level: Not on file  Occupational History   Not on file  Tobacco Use   Smoking status: Never   Smokeless tobacco: Current    Types: Snuff  Substance and Sexual Activity   Alcohol use: Yes    Comment: Occassional   Drug use: Never   Sexual activity: Not on file  Other Topics  Concern   Not on file  Social History Narrative   Not on file   Social Determinants of Health   Financial Resource Strain: Not on file  Food Insecurity: Not on file  Transportation Needs: Not on file  Physical Activity: Not on file  Stress: Not on file  Social Connections: Not on file  Intimate Partner Violence: Not on file    Review of Systems  All other systems reviewed and are negative.       Objective    BP (!) 139/91   Pulse 77   Temp 98.1 F (36.7 C) (Oral)   Wt (!) 305 lb (138.3 kg)   BMI 41.37 kg/m   Physical Exam Vitals and nursing note reviewed.  Constitutional:      General: He is not in acute distress.    Appearance: He is obese.  Cardiovascular:     Rate and Rhythm: Normal rate and regular rhythm.  Pulmonary:     Effort: Pulmonary effort is normal.     Breath sounds: Normal breath sounds.  Abdominal:     Palpations: Abdomen is soft.     Tenderness: There is no abdominal tenderness.  Neurological:     General: No focal deficit present.     Mental Status: He is alert and oriented to  person, place, and time.         Assessment & Plan:   1. Encounter for weight management Patient overall is doing well with phentermine. Will   2. Class 3 severe obesity due to excess calories without serious comorbidity with body mass index (BMI) of 40.0 to 44.9 in adult [facility])    Return in about 4 weeks (around 02/28/2022) for follow up.   Tommie Raymond, MD

## 2022-02-21 ENCOUNTER — Telehealth: Payer: 59 | Admitting: Physician Assistant

## 2022-02-21 DIAGNOSIS — R112 Nausea with vomiting, unspecified: Secondary | ICD-10-CM | POA: Diagnosis not present

## 2022-02-21 MED ORDER — ONDANSETRON 4 MG PO TBDP: 4.0000 mg | ORAL_TABLET | Freq: Three times a day (TID) | ORAL | 0 refills | Status: DC | PRN

## 2022-02-21 NOTE — Progress Notes (Signed)

## 2022-03-11 ENCOUNTER — Encounter: Payer: Self-pay | Admitting: Family Medicine

## 2022-03-11 ENCOUNTER — Ambulatory Visit: Payer: 59 | Admitting: Family Medicine

## 2022-03-11 VITALS — BP 134/88 | HR 75 | Temp 98.1°F | Resp 16 | Wt 300.6 lb

## 2022-03-11 DIAGNOSIS — Z7689 Persons encountering health services in other specified circumstances: Secondary | ICD-10-CM | POA: Diagnosis not present

## 2022-03-11 DIAGNOSIS — Z6841 Body Mass Index (BMI) 40.0 and over, adult: Secondary | ICD-10-CM | POA: Diagnosis not present

## 2022-03-11 MED ORDER — PHENTERMINE HCL 37.5 MG PO TABS: 37.5000 mg | ORAL_TABLET | Freq: Every day | ORAL | 0 refills | Status: DC

## 2022-03-12 NOTE — Progress Notes (Signed)
Established Patient Office Visit  Subjective    Patient ID: Derek Fernandez, male    DOB: 02/25/1991  Age: 31 y.o. MRN: [id number]  CC: No chief complaint on file.   HPI ADARRIUS GRAEFF presents for routine weight management. Denies acute complaints or concerns.    Outpatient Encounter Medications as of 03/11/2022  Medication Sig   chlorpheniramine (CHLOR-TRIMETON) 4 MG tablet 1 tablet as needed   famotidine (PEPCID) 20 MG tablet    fluticasone (FLONASE) 50 MCG/ACT nasal spray Place 2 sprays into both nostrils daily.   hyoscyamine (LEVSIN SL) 0.125 MG SL tablet 1 tablet as needed   ondansetron (ZOFRAN-ODT) 4 MG disintegrating tablet Take 1 tablet (4 mg total) by mouth every 8 (eight) hours as needed for nausea or vomiting.   phentermine (ADIPEX-P) 37.5 MG tablet Take 1 tablet (37.5 mg total) by mouth daily before breakfast.   traZODone (DESYREL) 50 MG tablet TAKE 1/2 TO 1 TABLET(25 TO 50 MG) BY MOUTH AT BEDTIME AS NEEDED FOR SLEEP   [DISCONTINUED] phentermine (ADIPEX-P) 37.5 MG tablet Take 1 tablet (37.5 mg total) by mouth daily before breakfast.   No facility-administered encounter medications on file as of 03/11/2022.    Past Medical History:  Diagnosis Date   Personal history of asthma     Past Surgical History:  Procedure Laterality Date   TONSILLECTOMY      Family History  Problem Relation Age of Onset   Migraines Father        severe & give stroke like symptoms   Diabetes Paternal Grandmother    Heart disease Paternal Grandmother    Diabetes Paternal Grandfather    Heart disease Paternal Grandfather     Social History   Socioeconomic History   Marital status: Married    Spouse name: Not on file   Number of children: Not on file   Years of education: Not on file   Highest education level: Not on file  Occupational History   Not on file  Tobacco Use   Smoking status: Never   Smokeless tobacco: Current    Types: Snuff  Substance and Sexual Activity    Alcohol use: Yes    Comment: Occassional   Drug use: Never   Sexual activity: Not on file  Other Topics Concern   Not on file  Social History Narrative   Not on file   Social Determinants of Health   Financial Resource Strain: Not on file  Food Insecurity: Not on file  Transportation Needs: Not on file  Physical Activity: Not on file  Stress: Not on file  Social Connections: Not on file  Intimate Partner Violence: Not on file    Review of Systems  All other systems reviewed and are negative.       Objective    BP 134/88   Pulse 75   Temp 98.1 F (36.7 C) (Oral)   Resp 16   Wt (!) 300 lb 9.6 oz (136.4 kg)   SpO2 97%   BMI 40.77 kg/m   Physical Exam Vitals and nursing note reviewed.  Constitutional:      General: He is not in acute distress.    Appearance: He is obese.  Cardiovascular:     Rate and Rhythm: Normal rate and regular rhythm.  Pulmonary:     Effort: Pulmonary effort is normal.     Breath sounds: Normal breath sounds.  Abdominal:     Palpations: Abdomen is soft.     Tenderness: There  is no abdominal tenderness.  Neurological:     General: No focal deficit present.     Mental Status: He is alert and oriented to person, place, and time.         Assessment & Plan:   1. Encounter for weight management Phentermine refilled. Goal is 4-6lbs wt loss this month  2. Class 3 severe obesity due to excess calories without serious comorbidity with body mass index (BMI) of 40.0 to 44.9 in adult [facility])     Return in about 4 weeks (around 04/08/2022) for follow up.   Tommie Raymond, MD

## 2022-04-08 ENCOUNTER — Ambulatory Visit: Payer: 59 | Admitting: Family Medicine

## 2022-04-08 ENCOUNTER — Encounter: Payer: Self-pay | Admitting: Family Medicine

## 2022-04-08 VITALS — BP 131/83 | HR 86 | Temp 97.7°F | Resp 16 | Wt 295.0 lb

## 2022-04-08 DIAGNOSIS — Z7689 Persons encountering health services in other specified circumstances: Secondary | ICD-10-CM | POA: Diagnosis not present

## 2022-04-08 DIAGNOSIS — Z6841 Body Mass Index (BMI) 40.0 and over, adult: Secondary | ICD-10-CM | POA: Diagnosis not present

## 2022-04-08 MED ORDER — PHENTERMINE HCL 37.5 MG PO TABS: 37.5000 mg | ORAL_TABLET | Freq: Every day | ORAL | 0 refills | Status: DC

## 2022-04-11 ENCOUNTER — Encounter: Payer: Self-pay | Admitting: Family Medicine

## 2022-04-11 NOTE — Progress Notes (Signed)
Established Patient Office Visit  Subjective    Patient ID: Derek Fernandez, male    DOB: 1991/08/15  Age: 31 y.o. MRN: [id number]  CC: No chief complaint on file.   HPI Derek Fernandez presents for weight management. Patient denies acute complaints or concerns.   Outpatient Encounter Medications as of 04/08/2022  Medication Sig   chlorpheniramine (CHLOR-TRIMETON) 4 MG tablet 1 tablet as needed   famotidine (PEPCID) 20 MG tablet    fluticasone (FLONASE) 50 MCG/ACT nasal spray Place 2 sprays into both nostrils daily.   hyoscyamine (LEVSIN SL) 0.125 MG SL tablet 1 tablet as needed   ondansetron (ZOFRAN-ODT) 4 MG disintegrating tablet Take 1 tablet (4 mg total) by mouth every 8 (eight) hours as needed for nausea or vomiting.   phentermine (ADIPEX-P) 37.5 MG tablet Take 1 tablet (37.5 mg total) by mouth daily before breakfast.   traZODone (DESYREL) 50 MG tablet TAKE 1/2 TO 1 TABLET(25 TO 50 MG) BY MOUTH AT BEDTIME AS NEEDED FOR SLEEP   [DISCONTINUED] phentermine (ADIPEX-P) 37.5 MG tablet Take 1 tablet (37.5 mg total) by mouth daily before breakfast.   No facility-administered encounter medications on file as of 04/08/2022.    Past Medical History:  Diagnosis Date   Personal history of asthma     Past Surgical History:  Procedure Laterality Date   TONSILLECTOMY      Family History  Problem Relation Age of Onset   Migraines Father        severe & give stroke like symptoms   Diabetes Paternal Grandmother    Heart disease Paternal Grandmother    Diabetes Paternal Grandfather    Heart disease Paternal Grandfather     Social History   Socioeconomic History   Marital status: Married    Spouse name: Not on file   Number of children: Not on file   Years of education: Not on file   Highest education level: Not on file  Occupational History   Not on file  Tobacco Use   Smoking status: Never   Smokeless tobacco: Current    Types: Snuff  Substance and Sexual Activity    Alcohol use: Yes    Comment: Occassional   Drug use: Never   Sexual activity: Not on file  Other Topics Concern   Not on file  Social History Narrative   Not on file   Social Determinants of Health   Financial Resource Strain: Not on file  Food Insecurity: Not on file  Transportation Needs: Not on file  Physical Activity: Not on file  Stress: Not on file  Social Connections: Not on file  Intimate Partner Violence: Not on file    Review of Systems  All other systems reviewed and are negative.       Objective    BP 131/83   Pulse 86   Temp 97.7 F (36.5 C) (Oral)   Resp 16   Wt 295 lb (133.8 kg)   SpO2 96%   BMI 40.01 kg/m   Physical Exam Vitals and nursing note reviewed.  Constitutional:      General: He is not in acute distress.    Appearance: He is obese.  Cardiovascular:     Rate and Rhythm: Normal rate and regular rhythm.  Pulmonary:     Effort: Pulmonary effort is normal.     Breath sounds: Normal breath sounds.  Abdominal:     Palpations: Abdomen is soft.     Tenderness: There is no abdominal tenderness.  Neurological:     General: No focal deficit present.     Mental Status: He is alert and oriented to person, place, and time.         Assessment & Plan:   1. Encounter for weight management Doing well with present management. Meds refilled.   2. Class 3 severe obesity due to excess calories without serious comorbidity with body mass index (BMI) of 40.0 to 44.9 in adult [facility])    No follow-ups on file.   Tommie Raymond, MD

## 2022-05-13 ENCOUNTER — Encounter: Payer: Self-pay | Admitting: Family Medicine

## 2022-05-13 ENCOUNTER — Ambulatory Visit (INDEPENDENT_AMBULATORY_CARE_PROVIDER_SITE_OTHER): Payer: 59 | Admitting: Family Medicine

## 2022-05-13 VITALS — BP 133/81 | HR 92 | Temp 98.1°F | Resp 16 | Wt 297.4 lb

## 2022-05-13 DIAGNOSIS — Z7689 Persons encountering health services in other specified circumstances: Secondary | ICD-10-CM | POA: Diagnosis not present

## 2022-05-13 DIAGNOSIS — Z6841 Body Mass Index (BMI) 40.0 and over, adult: Secondary | ICD-10-CM | POA: Diagnosis not present

## 2022-05-13 MED ORDER — PHENTERMINE HCL 37.5 MG PO TABS: 37.5000 mg | ORAL_TABLET | Freq: Every day | ORAL | 0 refills | Status: DC

## 2022-05-13 NOTE — Progress Notes (Signed)
Established Patient Office Visit  Subjective    Patient ID: Derek Fernandez, male    DOB: 10-14-1990  Age: 31 y.o. MRN: [id number]  CC:  Chief Complaint  Patient presents with   Weight Check    HPI Derek Fernandez presents for weight managemnet. HE reports that he was our of town with family and was not as disciplined as before.    Outpatient Encounter Medications as of 05/13/2022  Medication Sig   chlorpheniramine (CHLOR-TRIMETON) 4 MG tablet 1 tablet as needed   famotidine (PEPCID) 20 MG tablet    fluticasone (FLONASE) 50 MCG/ACT nasal spray Place 2 sprays into both nostrils daily.   hyoscyamine (LEVSIN SL) 0.125 MG SL tablet 1 tablet as needed   ondansetron (ZOFRAN-ODT) 4 MG disintegrating tablet Take 1 tablet (4 mg total) by mouth every 8 (eight) hours as needed for nausea or vomiting.   phentermine (ADIPEX-P) 37.5 MG tablet Take 1 tablet (37.5 mg total) by mouth daily before breakfast.   [DISCONTINUED] phentermine (ADIPEX-P) 37.5 MG tablet Take 1 tablet (37.5 mg total) by mouth daily before breakfast.   [DISCONTINUED] traZODone (DESYREL) 50 MG tablet TAKE 1/2 TO 1 TABLET(25 TO 50 MG) BY MOUTH AT BEDTIME AS NEEDED FOR SLEEP   No facility-administered encounter medications on file as of 05/13/2022.    Past Medical History:  Diagnosis Date   Personal history of asthma     Past Surgical History:  Procedure Laterality Date   TONSILLECTOMY      Family History  Problem Relation Age of Onset   Migraines Father        severe & give stroke like symptoms   Diabetes Paternal Grandmother    Heart disease Paternal Grandmother    Diabetes Paternal Grandfather    Heart disease Paternal Grandfather     Social History   Socioeconomic History   Marital status: Married    Spouse name: Not on file   Number of children: Not on file   Years of education: Not on file   Highest education level: Not on file  Occupational History   Not on file  Tobacco Use   Smoking status:  Never   Smokeless tobacco: Current    Types: Snuff  Substance and Sexual Activity   Alcohol use: Yes    Comment: Occassional   Drug use: Never   Sexual activity: Not on file  Other Topics Concern   Not on file  Social History Narrative   Not on file   Social Determinants of Health   Financial Resource Strain: Not on file  Food Insecurity: Not on file  Transportation Needs: Not on file  Physical Activity: Not on file  Stress: Not on file  Social Connections: Not on file  Intimate Partner Violence: Not on file    Review of Systems  All other systems reviewed and are negative.       Objective    BP 133/81   Pulse 92   Temp 98.1 F (36.7 C) (Oral)   Resp 16   Wt 297 lb 6.4 oz (134.9 kg)   SpO2 96%   BMI 40.33 kg/m   Physical Exam Vitals and nursing note reviewed.  Constitutional:      General: He is not in acute distress.    Appearance: He is obese.  Cardiovascular:     Rate and Rhythm: Normal rate and regular rhythm.  Pulmonary:     Effort: Pulmonary effort is normal.     Breath sounds: Normal  breath sounds.  Abdominal:     Palpations: Abdomen is soft.     Tenderness: There is no abdominal tenderness.  Neurological:     General: No focal deficit present.     Mental Status: He is alert and oriented to person, place, and time.         Assessment & Plan:   1. Encounter for weight management Discussed compliance. Meds refilled. Goal is 4-6lbs/mo wt loss.   2. Class 3 severe obesity due to excess calories without serious comorbidity with body mass index (BMI) of 40.0 to 44.9 in adult [facility])     No follow-ups on file.   Tommie Raymond, MD

## 2022-06-10 ENCOUNTER — Ambulatory Visit (INDEPENDENT_AMBULATORY_CARE_PROVIDER_SITE_OTHER): Payer: 59 | Admitting: Family Medicine

## 2022-06-10 ENCOUNTER — Encounter: Payer: Self-pay | Admitting: Family Medicine

## 2022-06-10 VITALS — BP 144/88 | HR 96 | Temp 98.1°F | Resp 16 | Wt 291.0 lb

## 2022-06-10 DIAGNOSIS — Z7689 Persons encountering health services in other specified circumstances: Secondary | ICD-10-CM

## 2022-06-10 DIAGNOSIS — E6609 Other obesity due to excess calories: Secondary | ICD-10-CM

## 2022-06-10 DIAGNOSIS — Z6839 Body mass index (BMI) 39.0-39.9, adult: Secondary | ICD-10-CM

## 2022-06-10 MED ORDER — PHENTERMINE HCL 37.5 MG PO TABS: 37.5000 mg | ORAL_TABLET | Freq: Every day | ORAL | 0 refills | Status: DC

## 2022-06-13 NOTE — Progress Notes (Signed)
Established Patient Office Visit  Subjective    Patient ID: Derek Fernandez, male    DOB: Jun 29, 1991  Age: 31 y.o. MRN: [id number]  CC:  Chief Complaint  Patient presents with   Weight Check    HPI Derek Fernandez presents for routine weight check. Patient denies acute complaints or concerns.    Outpatient Encounter Medications as of 06/10/2022  Medication Sig   chlorpheniramine (CHLOR-TRIMETON) 4 MG tablet 1 tablet as needed   famotidine (PEPCID) 20 MG tablet    fluticasone (FLONASE) 50 MCG/ACT nasal spray Place 2 sprays into both nostrils daily.   hyoscyamine (LEVSIN SL) 0.125 MG SL tablet 1 tablet as needed   ondansetron (ZOFRAN-ODT) 4 MG disintegrating tablet Take 1 tablet (4 mg total) by mouth every 8 (eight) hours as needed for nausea or vomiting.   phentermine (ADIPEX-P) 37.5 MG tablet Take 1 tablet (37.5 mg total) by mouth daily before breakfast.   [DISCONTINUED] phentermine (ADIPEX-P) 37.5 MG tablet Take 1 tablet (37.5 mg total) by mouth daily before breakfast.   No facility-administered encounter medications on file as of 06/10/2022.    Past Medical History:  Diagnosis Date   Personal history of asthma     Past Surgical History:  Procedure Laterality Date   TONSILLECTOMY      Family History  Problem Relation Age of Onset   Migraines Father        severe & give stroke like symptoms   Diabetes Paternal Grandmother    Heart disease Paternal Grandmother    Diabetes Paternal Grandfather    Heart disease Paternal Grandfather     Social History   Socioeconomic History   Marital status: Married    Spouse name: Not on file   Number of children: Not on file   Years of education: Not on file   Highest education level: Not on file  Occupational History   Not on file  Tobacco Use   Smoking status: Never   Smokeless tobacco: Current    Types: Snuff  Substance and Sexual Activity   Alcohol use: Yes    Comment: Occassional   Drug use: Never   Sexual  activity: Not on file  Other Topics Concern   Not on file  Social History Narrative   Not on file   Social Determinants of Health   Financial Resource Strain: Not on file  Food Insecurity: Not on file  Transportation Needs: Not on file  Physical Activity: Not on file  Stress: Not on file  Social Connections: Not on file  Intimate Partner Violence: Not on file    Review of Systems  All other systems reviewed and are negative.       Objective    BP (!) 144/88   Pulse 96   Temp 98.1 F (36.7 C) (Oral)   Resp 16   Wt 291 lb (132 kg)   SpO2 95%   BMI 39.47 kg/m   Physical Exam Vitals and nursing note reviewed.  Constitutional:      General: He is not in acute distress.    Appearance: He is obese.  Cardiovascular:     Rate and Rhythm: Normal rate and regular rhythm.  Pulmonary:     Effort: Pulmonary effort is normal.     Breath sounds: Normal breath sounds.  Abdominal:     Palpations: Abdomen is soft.     Tenderness: There is no abdominal tenderness.  Neurological:     General: No focal deficit present.  Mental Status: He is alert and oriented to person, place, and time.         Assessment & Plan:   1. Encounter for weight management Doing well with present management. Continue. Meds refilled.   2. Class 2 obesity due to excess calories without serious comorbidity with body mass index (BMI) of 39.0 to 39.9 in adult     No follow-ups on file.   Becky Sax, MD

## 2022-07-06 ENCOUNTER — Encounter: Payer: Self-pay | Admitting: *Deleted

## 2022-07-06 ENCOUNTER — Ambulatory Visit (INDEPENDENT_AMBULATORY_CARE_PROVIDER_SITE_OTHER): Payer: 59 | Admitting: Physician Assistant

## 2022-07-06 ENCOUNTER — Encounter: Payer: Self-pay | Admitting: Physician Assistant

## 2022-07-06 VITALS — BP 130/84 | HR 94 | Temp 98.1°F | Resp 16 | Wt 287.0 lb

## 2022-07-06 DIAGNOSIS — E6609 Other obesity due to excess calories: Secondary | ICD-10-CM | POA: Diagnosis not present

## 2022-07-06 DIAGNOSIS — Z6839 Body mass index (BMI) 39.0-39.9, adult: Secondary | ICD-10-CM

## 2022-07-06 DIAGNOSIS — R21 Rash and other nonspecific skin eruption: Secondary | ICD-10-CM | POA: Diagnosis not present

## 2022-07-06 MED ORDER — PHENTERMINE HCL 37.5 MG PO TABS: 37.5000 mg | ORAL_TABLET | Freq: Every day | ORAL | 0 refills | Status: DC

## 2022-07-06 MED ORDER — KETOCONAZOLE 2 % EX CREA: 1.0000 | TOPICAL_CREAM | Freq: Every day | CUTANEOUS | 0 refills | Status: DC

## 2022-07-06 NOTE — Progress Notes (Signed)
Patient ID: Derek Fernandez, male   DOB: 1991/06/26, 31 y.o.   MRN: [id number]   Derek Fernandez, is a 31 y.o. male  NID:[id number]  RWE:[id number]  DOB - 1991-03-10  Chief Complaint  Patient presents with   Weight Check   Rash       Subjective:   Derek Fernandez is a 31 y.o. male here today for weight check and phentermine RF.  Doing well.  Weight from 291-1 month ago to 287 today.  No SE from meds.  No CP/SOB or palpitations.  Also c/o periodic rash that comes up on his B volar wrists or back of his neck at times.  Does not typically itch but used his mom's ketoconazole cream and it helped it go away.  He would like a prescription of this No problems updated.  ALLERGIES: No Known Allergies  PAST MEDICAL HISTORY: Past Medical History:  Diagnosis Date   Personal history of asthma     MEDICATIONS AT HOME: Prior to Admission medications   Medication Sig Start Date End Date Taking? Authorizing Provider  ketoconazole (NIZORAL) 2 % cream Apply 1 Application topically daily. 07/06/22  Yes Monzerrat Wellen M, PA-C  chlorpheniramine (CHLOR-TRIMETON) 4 MG tablet 1 tablet as needed    [provider]  famotidine (PEPCID) 20 MG tablet     [provider]  fluticasone (FLONASE) 50 MCG/ACT nasal spray Place 2 sprays into both nostrils daily. 05/20/20   Arvilla Market, MD  hyoscyamine (LEVSIN SL) 0.125 MG SL tablet 1 tablet as needed 04/09/21   [provider]  ondansetron (ZOFRAN-ODT) 4 MG disintegrating tablet Take 1 tablet (4 mg total) by mouth every 8 (eight) hours as needed for nausea or vomiting. 02/21/22   Margaretann Loveless, PA-C  phentermine (ADIPEX-P) 37.5 MG tablet Take 1 tablet (37.5 mg total) by mouth daily before breakfast. 07/06/22   Wendelin Bradt, Marzella Schlein, PA-C    ROS: Neg HEENT Neg resp Neg cardiac Neg GI Neg GU Neg MS Neg psych Neg neuro  Objective:   Vitals:   07/06/22 1409  BP: 130/84  Pulse: 94  Resp: 16  Temp: 98.1 F  (36.7 C)  TempSrc: Oral  SpO2: 98%  Weight: 287 lb (130.2 kg)   Exam General appearance : Awake, alert, not in any distress. Speech Clear. Not toxic looking HEENT: Atraumatic and Normocephalic, pupils equally reactive to light and accomodation Neck: Supple, no JVD. No cervical lymphadenopathy.  Chest: Good air entry bilaterally, CTAB.  No rales/rhonchi/wheezing CVS: S1 S2 regular, no murmurs.  Abdomen: Bowel sounds present, Non tender and not distended with no gaurding, rigidity or rebound. Extremities: B/L Lower Ext shows no edema, both legs are warm to touch Neurology: Awake alert, and oriented X 3, CN II-XII intact, Non focal Skin: 2-3 mm patches on volar aspects B wrists.  No secondary infection  Data Review Lab Results  Component Value Date   HGBA1C 5.7 (H) 09/10/2021   HGBA1C 5.2 03/14/2019    Assessment & Plan   1. Class 2 obesity due to excess calories without serious comorbidity with body mass index (BMI) of 39.0 to 39.9 in adult Stable and making progress.  Continue to work on healthy diet and exercise - phentermine (ADIPEX-P) 37.5 MG tablet; Take 1 tablet (37.5 mg total) by mouth daily before breakfast.  Dispense: 30 tablet; Refill: 0 BP great today  2. Rash Can try - ketoconazole (NIZORAL) 2 % cream; Apply 1 Application topically daily.  Dispense: 30 g; Refill: 0  Return in about 4 weeks (around 08/03/2022) for PCP for weight management.  The patient was given clear instructions to go to ER or return to medical center if symptoms don't improve, worsen or new problems develop. The patient verbalized understanding. The patient was told to call to get lab results if they haven't heard anything in the next week.      Georgian Co, PA-C [facility] [place], [place] [phone]   07/06/2022, 2:19 PM

## 2022-07-06 NOTE — Progress Notes (Signed)
Patient is here for weight in.  Patient also c/o rash that comes up when  he is very sweaty. Patient would like a  cream.

## 2022-07-28 ENCOUNTER — Encounter: Payer: Self-pay | Admitting: *Deleted

## 2022-07-29 ENCOUNTER — Other Ambulatory Visit: Payer: Self-pay | Admitting: Family Medicine

## 2022-07-29 DIAGNOSIS — E6609 Other obesity due to excess calories: Secondary | ICD-10-CM

## 2022-07-29 MED ORDER — PHENTERMINE HCL 37.5 MG PO TABS: 37.5000 mg | ORAL_TABLET | Freq: Every day | ORAL | 0 refills | Status: DC

## 2022-08-05 ENCOUNTER — Ambulatory Visit: Payer: 59

## 2022-08-05 ENCOUNTER — Ambulatory Visit: Payer: 59 | Admitting: Family Medicine

## 2022-08-05 NOTE — Progress Notes (Signed)
Patient came in for weight

## 2022-09-02 ENCOUNTER — Encounter: Payer: Self-pay | Admitting: Family Medicine

## 2022-09-02 ENCOUNTER — Ambulatory Visit (INDEPENDENT_AMBULATORY_CARE_PROVIDER_SITE_OTHER): Payer: 59 | Admitting: Family Medicine

## 2022-09-02 VITALS — BP 127/85 | HR 82 | Temp 97.7°F | Resp 16 | Wt 286.7 lb

## 2022-09-02 DIAGNOSIS — E6609 Other obesity due to excess calories: Secondary | ICD-10-CM

## 2022-09-02 DIAGNOSIS — R112 Nausea with vomiting, unspecified: Secondary | ICD-10-CM

## 2022-09-02 DIAGNOSIS — Z6839 Body mass index (BMI) 39.0-39.9, adult: Secondary | ICD-10-CM | POA: Diagnosis not present

## 2022-09-02 DIAGNOSIS — Z7689 Persons encountering health services in other specified circumstances: Secondary | ICD-10-CM | POA: Diagnosis not present

## 2022-09-02 MED ORDER — PHENTERMINE HCL 37.5 MG PO TABS: 37.5000 mg | ORAL_TABLET | Freq: Every day | ORAL | 0 refills | Status: DC

## 2022-09-02 MED ORDER — ONDANSETRON 4 MG PO TBDP: 4.0000 mg | ORAL_TABLET | Freq: Three times a day (TID) | ORAL | 1 refills | Status: DC | PRN

## 2022-09-02 NOTE — Progress Notes (Signed)
Patient came in for monthly weight check. Patient has no other concerns today

## 2022-09-05 ENCOUNTER — Encounter: Payer: Self-pay | Admitting: Family Medicine

## 2022-09-05 NOTE — Progress Notes (Signed)
Established Patient Office Visit  Subjective    Patient ID: Derek Fernandez, male    DOB: Dec 18, 1990  Age: 32 y.o. MRN: [id number]  CC: No chief complaint on file.   HPI ASIR BINGLEY presents for routine weight management. Patient reports that he continues with intermittent nausea.    Outpatient Encounter Medications as of 09/02/2022  Medication Sig   chlorpheniramine (CHLOR-TRIMETON) 4 MG tablet 1 tablet as needed   famotidine (PEPCID) 20 MG tablet    fluticasone (FLONASE) 50 MCG/ACT nasal spray Place 2 sprays into both nostrils daily.   hyoscyamine (LEVSIN SL) 0.125 MG SL tablet 1 tablet as needed   ketoconazole (NIZORAL) 2 % cream Apply 1 Application topically daily.   levocetirizine (XYZAL) 5 MG tablet 1 tablet in the evening Orally Once a day for 30 day(s)   [DISCONTINUED] ondansetron (ZOFRAN-ODT) 4 MG disintegrating tablet Take 1 tablet (4 mg total) by mouth every 8 (eight) hours as needed for nausea or vomiting.   [DISCONTINUED] phentermine (ADIPEX-P) 37.5 MG tablet Take 1 tablet (37.5 mg total) by mouth daily before breakfast.   ondansetron (ZOFRAN-ODT) 4 MG disintegrating tablet Take 1 tablet (4 mg total) by mouth every 8 (eight) hours as needed for nausea or vomiting.   phentermine (ADIPEX-P) 37.5 MG tablet Take 1 tablet (37.5 mg total) by mouth daily before breakfast.   No facility-administered encounter medications on file as of 09/02/2022.    Past Medical History:  Diagnosis Date   Personal history of asthma     Past Surgical History:  Procedure Laterality Date   TONSILLECTOMY      Family History  Problem Relation Age of Onset   Migraines Father        severe & give stroke like symptoms   Diabetes Paternal Grandmother    Heart disease Paternal Grandmother    Diabetes Paternal Grandfather    Heart disease Paternal Grandfather     Social History   Socioeconomic History   Marital status: Married    Spouse name: Not on file   Number of children:  Not on file   Years of education: Not on file   Highest education level: Not on file  Occupational History   Not on file  Tobacco Use   Smoking status: Never   Smokeless tobacco: Current    Types: Snuff  Substance and Sexual Activity   Alcohol use: Yes    Comment: Occassional   Drug use: Never   Sexual activity: Not on file  Other Topics Concern   Not on file  Social History Narrative   Not on file   Social Determinants of Health   Financial Resource Strain: Not on file  Food Insecurity: Not on file  Transportation Needs: Not on file  Physical Activity: Not on file  Stress: Not on file  Social Connections: Not on file  Intimate Partner Violence: Not on file    Review of Systems  Gastrointestinal:  Positive for nausea.  All other systems reviewed and are negative.       Objective    BP 127/85   Pulse 82   Temp 97.7 F (36.5 C) (Oral)   Resp 16   Wt 286 lb 11.2 oz (130 kg)   SpO2 97%   BMI 38.88 kg/m   Physical Exam Vitals and nursing note reviewed.  Constitutional:      General: He is not in acute distress.    Appearance: He is obese.  Cardiovascular:     Rate  and Rhythm: Normal rate and regular rhythm.  Pulmonary:     Effort: Pulmonary effort is normal.     Breath sounds: Normal breath sounds.  Abdominal:     Palpations: Abdomen is soft.     Tenderness: There is no abdominal tenderness.  Neurological:     General: No focal deficit present.     Mental Status: He is alert and oriented to person, place, and time.         Assessment & Plan:   1. Encounter for weight management Doing well. Continue present management. Phentermine refilled.   2. Class 2 obesity due to excess calories without serious comorbidity with body mass index (BMI) of 39.0 to 39.9 in adult  - phentermine (ADIPEX-P) 37.5 MG tablet; Take 1 tablet (37.5 mg total) by mouth daily before breakfast.  Dispense: 30 tablet; Refill: 0  3. Nausea and vomiting, unspecified vomiting  type  - ondansetron (ZOFRAN-ODT) 4 MG disintegrating tablet; Take 1 tablet (4 mg total) by mouth every 8 (eight) hours as needed for nausea or vomiting.  Dispense: 30 tablet; Refill: 1    Return in about 4 weeks (around 09/30/2022) for follow up.   Becky Sax, MD

## 2022-10-11 ENCOUNTER — Encounter: Payer: Self-pay | Admitting: Family Medicine

## 2022-10-11 ENCOUNTER — Ambulatory Visit (INDEPENDENT_AMBULATORY_CARE_PROVIDER_SITE_OTHER): Payer: 59 | Admitting: Family Medicine

## 2022-10-11 VITALS — BP 132/85 | HR 77 | Temp 98.0°F | Resp 16 | Ht 72.0 in | Wt 283.6 lb

## 2022-10-11 DIAGNOSIS — Z13220 Encounter for screening for lipoid disorders: Secondary | ICD-10-CM | POA: Diagnosis not present

## 2022-10-11 DIAGNOSIS — Z130 Encounter for screening for diseases of the blood and blood-forming organs and certain disorders involving the immune mechanism: Secondary | ICD-10-CM

## 2022-10-11 DIAGNOSIS — Z0000 Encounter for general adult medical examination without abnormal findings: Secondary | ICD-10-CM

## 2022-10-11 NOTE — Progress Notes (Signed)
Estasblished Patient Office Visit  Subjective    Patient ID: Derek Fernandez, male    DOB: [birth year]  Age: 32 y.o. MRN: [id number]  CC:  Chief Complaint  Patient presents with   Annual Exam    HPI [name] presents for routine annual exam. He reports that he is also doing well with weight loss - having lost 50+ lbs. Patient denies acute complaints or concerns.    Outpatient Encounter Medications as of 10/11/2022  Medication Sig   chlorpheniramine (CHLOR-TRIMETON) 4 MG tablet 1 tablet as needed   fluticasone (FLONASE) 50 MCG/ACT nasal spray Place 2 sprays into both nostrils daily.   hyoscyamine (LEVSIN SL) 0.125 MG SL tablet 1 tablet as needed   ketoconazole (NIZORAL) 2 % cream Apply 1 Application topically daily.   levocetirizine (XYZAL) 5 MG tablet 1 tablet in the evening Orally Once a day for 30 day(s)   ondansetron (ZOFRAN-ODT) 4 MG disintegrating tablet Take 1 tablet (4 mg total) by mouth every 8 (eight) hours as needed for nausea or vomiting.   phentermine (ADIPEX-P) 37.5 MG tablet Take 1 tablet (37.5 mg total) by mouth daily before breakfast.   [DISCONTINUED] famotidine (PEPCID) 20 MG tablet    No facility-administered encounter medications on file as of 10/11/2022.    Past Medical History:  Diagnosis Date   Personal history of asthma     Past Surgical History:  Procedure Laterality Date   TONSILLECTOMY      Family History  Problem Relation Age of Onset   Migraines Father        severe & give stroke like symptoms   Diabetes Paternal Grandmother    Heart disease Paternal Grandmother    Diabetes Paternal Grandfather    Heart disease Paternal Grandfather     Social History   Socioeconomic History   Marital status: Married    Spouse name: Not on file   Number of children: Not on file   Years of education: Not on file   Highest education level: Not on file  Occupational History   Not on file  Tobacco Use   Smoking status: Never   Smokeless  tobacco: Current    Types: Snuff  Substance and Sexual Activity   Alcohol use: Yes    Comment: Occassional   Drug use: Never   Sexual activity: Yes  Other Topics Concern   Not on file  Social History Narrative   Not on file   Social Determinants of Health   Financial Resource Strain: Not on file  Food Insecurity: Not on file  Transportation Needs: Not on file  Physical Activity: Not on file  Stress: Not on file  Social Connections: Not on file  Intimate Partner Violence: Not on file    Review of Systems  All other systems reviewed and are negative.       Objective    BP 132/85   Pulse 77   Temp 98 F (36.7 C) (Oral)   Resp 16   Ht 6' (1.829 m)   Wt 283 lb 9.6 oz (128.6 kg)   SpO2 95%   BMI 38.46 kg/m   Physical Exam Vitals and nursing note reviewed.  Constitutional:      General: He is not in acute distress. HENT:     Head: Normocephalic and atraumatic.     Right Ear: Tympanic membrane, ear canal and external ear normal.     Left Ear: Tympanic membrane, ear canal and external ear normal.  Nose: Nose normal.     Mouth/Throat:     Mouth: Mucous membranes are moist.     Pharynx: Oropharynx is clear.  Eyes:     Conjunctiva/sclera: Conjunctivae normal.     Pupils: Pupils are equal, round, and reactive to light.  Neck:     Thyroid: No thyromegaly.  Cardiovascular:     Rate and Rhythm: Normal rate and regular rhythm.     Heart sounds: Normal heart sounds. No murmur heard. Pulmonary:     Effort: Pulmonary effort is normal.     Breath sounds: Normal breath sounds.  Abdominal:     General: There is no distension.     Palpations: Abdomen is soft. There is no mass.     Tenderness: There is no abdominal tenderness.     Hernia: There is no hernia in the left inguinal area or right inguinal area.  Genitourinary:    Penis: Normal and circumcised.      Testes: Normal.  Musculoskeletal:        General: Normal range of motion.     Cervical back: Normal  range of motion and neck supple.     Right lower leg: No edema.     Left lower leg: No edema.  Skin:    General: Skin is warm and dry.  Neurological:     General: No focal deficit present.     Mental Status: He is alert and oriented to person, place, and time. Mental status is at baseline.  Psychiatric:        Mood and Affect: Mood normal.        Behavior: Behavior normal.         Assessment & Plan:   1. Annual physical exam  - CMP14+EGFR - Testosterone  2. Screening for deficiency anemia  - CBC with Differential  3. Screening for lipid disorders  - Lipid Panel    Return in about 3 months (around 01/09/2023) for follow up weight management.   Becky Sax, MD

## 2022-10-11 NOTE — Progress Notes (Signed)
Patient is here for complete physical examination. Patient has no other concerns today

## 2022-10-12 LAB — CBC WITH DIFFERENTIAL/PLATELET
Basophils Absolute: 0.1 10*3/uL (ref 0.0–0.2)
Basos: 1 %
EOS (ABSOLUTE): 0.1 10*3/uL (ref 0.0–0.4)
Eos: 1 %
Hematocrit: 49.3 % (ref 37.5–51.0)
Hemoglobin: 16.2 g/dL (ref 13.0–17.7)
Immature Grans (Abs): 0 10*3/uL (ref 0.0–0.1)
Immature Granulocytes: 0 %
Lymphocytes Absolute: 2.4 10*3/uL (ref 0.7–3.1)
Lymphs: 29 %
MCH: 30.1 pg (ref 26.6–33.0)
MCHC: 32.9 g/dL (ref 31.5–35.7)
MCV: 92 fL (ref 79–97)
Monocytes Absolute: 0.9 10*3/uL (ref 0.1–0.9)
Monocytes: 10 %
Neutrophils Absolute: 5 10*3/uL (ref 1.4–7.0)
Neutrophils: 59 %
Platelets: 296 10*3/uL (ref 150–450)
RBC: 5.38 x10E6/uL (ref 4.14–5.80)
RDW: 13.1 % (ref 11.6–15.4)
WBC: 8.5 10*3/uL (ref 3.4–10.8)

## 2022-10-12 LAB — CMP14+EGFR
ALT: 36 IU/L (ref 0–44)
AST: 29 IU/L (ref 0–40)
Albumin/Globulin Ratio: 1.8 (ref 1.2–2.2)
Albumin: 4.8 g/dL (ref 4.3–5.2)
Alkaline Phosphatase: 69 IU/L (ref 44–121)
BUN/Creatinine Ratio: 12 (ref 9–20)
BUN: 13 mg/dL (ref 6–20)
Bilirubin Total: 0.7 mg/dL (ref 0.0–1.2)
CO2: 24 mmol/L (ref 20–29)
Calcium: 9.9 mg/dL (ref 8.7–10.2)
Chloride: 101 mmol/L (ref 96–106)
Creatinine, Ser: 1.06 mg/dL (ref 0.76–1.27)
Globulin, Total: 2.7 g/dL (ref 1.5–4.5)
Glucose: 87 mg/dL (ref 70–99)
Potassium: 4.5 mmol/L (ref 3.5–5.2)
Sodium: 142 mmol/L (ref 134–144)
Total Protein: 7.5 g/dL (ref 6.0–8.5)
eGFR: 97 mL/min/{1.73_m2} (ref >59)

## 2022-10-12 LAB — LIPID PANEL
Chol/HDL Ratio: 4 ratio (ref 0.0–5.0)
Cholesterol, Total: 191 mg/dL (ref 100–199)
HDL: 48 mg/dL (ref >39)
LDL Chol Calc (NIH): 103 mg/dL — ABNORMAL HIGH (ref 0–99)
Triglycerides: 231 mg/dL — ABNORMAL HIGH (ref 0–149)
VLDL Cholesterol Cal: 40 mg/dL (ref 5–40)

## 2022-10-13 LAB — TESTOSTERONE: Testosterone: 350 ng/dL (ref 264–916)

## 2022-10-13 LAB — SPECIMEN STATUS REPORT

## 2022-11-08 ENCOUNTER — Telehealth: Payer: 59 | Admitting: Nurse Practitioner

## 2022-11-08 DIAGNOSIS — M545 Low back pain, unspecified: Secondary | ICD-10-CM

## 2022-11-08 MED ORDER — CYCLOBENZAPRINE HCL 10 MG PO TABS: 10.0000 mg | ORAL_TABLET | Freq: Three times a day (TID) | ORAL | 0 refills | Status: DC | PRN

## 2022-11-08 NOTE — Progress Notes (Signed)
We are sorry that you are not feeling well.  Here is how we plan to help!  Based on what you have shared with me it looks like you mostly have acute back pain.  Acute back pain is defined as musculoskeletal pain that can resolve in 1-3 weeks with conservative treatment.  I have prescribed  Flexeril 10 mg every eight hours as needed which is a muscle relaxer  If you are able to take ibuprofen you can alternate that with tylenol for pain control.    Some patients experience stomach irritation or in increased heartburn with anti-inflammatory drugs.  Please keep in mind that muscle relaxer's can cause fatigue and should not be taken while at work or driving.  Back pain is very common.  The pain often gets better over time.  The cause of back pain is usually not dangerous.  Most people can learn to manage their back pain on their own.  Home Care Stay active.  Start with short walks on flat ground if you can.  Try to walk farther each day. Do not sit, drive or stand in one place for more than 30 minutes.  Do not stay in bed. Do not avoid exercise or work.  Activity can help your back heal faster. Be careful when you bend or lift an object.  Bend at your knees, keep the object close to you, and do not twist. Sleep on a firm mattress.  Lie on your side, and bend your knees.  If you lie on your back, put a pillow under your knees. Only take medicines as told by your doctor. Put ice on the injured area. Put ice in a plastic bag Place a towel between your skin and the bag Leave the ice on for 15-20 minutes, 3-4 times a day for the first 2-3 days. 210 After that, you can switch between ice and heat packs. Ask your doctor about back exercises or massage. Avoid feeling anxious or stressed.  Find good ways to deal with stress, such as exercise.  Get Help Right Way If: Your pain does not go away with rest or medicine. Your pain does not go away in 1 week. You have new problems. You do not feel  well. The pain spreads into your legs. You cannot control when you poop (bowel movement) or pee (urinate) You feel sick to your stomach (nauseous) or throw up (vomit) You have belly (abdominal) pain. You feel like you may pass out (faint). If you develop a fever.  Make Sure you: Understand these instructions. Will watch your condition Will get help right away if you are not doing well or get worse.  Your e-visit answers were reviewed by a board certified advanced clinical practitioner to complete your personal care plan.  Depending on the condition, your plan could have included both over the counter or prescription medications.  If there is a problem please reply  once you have received a response from your provider.  Your safety is important to [place].  If you have drug allergies check your prescription carefully.    You can use MyChart to ask questions about today's visit, request a non-urgent call back, or ask for a work or school excuse for 24 hours related to this e-Visit. If it has been greater than 24 hours you will need to follow up with your provider, or enter a new e-Visit to address those concerns.  You will get an e-mail in the next two days asking about your experience.  I hope that your e-visit has been valuable and will speed your recovery. Thank you for using e-visits.   Meds ordered this encounter  Medications   cyclobenzaprine (FLEXERIL) 10 MG tablet    Sig: Take 1 tablet (10 mg total) by mouth 3 (three) times daily as needed for muscle spasms.    Dispense:  30 tablet    Refill:  0    I spent approximately 5 minutes reviewing the patient's history, current symptoms and coordinating their care today.

## 2023-01-06 ENCOUNTER — Ambulatory Visit: Payer: 59 | Admitting: Family Medicine

## 2023-01-06 VITALS — BP 121/81 | HR 70 | Temp 98.1°F | Resp 95 | Wt 294.0 lb

## 2023-01-06 DIAGNOSIS — Z7689 Persons encountering health services in other specified circumstances: Secondary | ICD-10-CM | POA: Diagnosis not present

## 2023-01-06 DIAGNOSIS — E6609 Other obesity due to excess calories: Secondary | ICD-10-CM | POA: Diagnosis not present

## 2023-01-06 DIAGNOSIS — Z6839 Body mass index (BMI) 39.0-39.9, adult: Secondary | ICD-10-CM

## 2023-01-06 MED ORDER — PHENTERMINE HCL 37.5 MG PO TABS: 37.5000 mg | ORAL_TABLET | Freq: Every day | ORAL | 0 refills | Status: DC

## 2023-01-06 NOTE — Progress Notes (Unsigned)
Patient came in for monthly weight check. Patient has no other concerns today  

## 2023-01-10 ENCOUNTER — Encounter: Payer: Self-pay | Admitting: Family Medicine

## 2023-01-10 NOTE — Progress Notes (Signed)
Established Patient Office Visit  Subjective    Patient ID: Derek Fernandez, male    DOB: February 17, 1991  Age: 32 y.o. MRN: [id number]  CC:  Chief Complaint  Patient presents with   Weight Loss    HPI Derek Fernandez presents for routine weight management. Patient denies acute complaints or concerns.    Outpatient Encounter Medications as of 01/06/2023  Medication Sig   chlorpheniramine (CHLOR-TRIMETON) 4 MG tablet 1 tablet as needed   cyclobenzaprine (FLEXERIL) 10 MG tablet Take 1 tablet (10 mg total) by mouth 3 (three) times daily as needed for muscle spasms. (Patient not taking: Reported on 01/06/2023)   fluticasone (FLONASE) 50 MCG/ACT nasal spray Place 2 sprays into both nostrils daily. (Patient not taking: Reported on 01/06/2023)   hyoscyamine (LEVSIN SL) 0.125 MG SL tablet 1 tablet as needed (Patient not taking: Reported on 01/06/2023)   ibuprofen (ADVIL) 800 MG tablet Take 800 mg by mouth 3 (three) times daily. (Patient not taking: Reported on 01/06/2023)   ketoconazole (NIZORAL) 2 % cream Apply 1 Application topically daily. (Patient not taking: Reported on 01/06/2023)   levocetirizine (XYZAL) 5 MG tablet 1 tablet in the evening Orally Once a day for 30 day(s) (Patient not taking: Reported on 01/06/2023)   mupirocin ointment (BACTROBAN) 2 % Apply topically 2 (two) times daily. (Patient not taking: Reported on 01/06/2023)   ondansetron (ZOFRAN-ODT) 4 MG disintegrating tablet Take 1 tablet (4 mg total) by mouth every 8 (eight) hours as needed for nausea or vomiting. (Patient not taking: Reported on 01/06/2023)   phentermine (ADIPEX-P) 37.5 MG tablet Take 1 tablet (37.5 mg total) by mouth daily before breakfast.   STATUS COVID-19/FLU A&B KIT TEST AS DIRECTED TODAY (Patient not taking: Reported on 01/06/2023)   sulfamethoxazole-trimethoprim (BACTRIM DS) 800-160 MG tablet Take 1 tablet by mouth 2 (two) times daily. (Patient not taking: Reported on 01/06/2023)   [DISCONTINUED] phentermine  (ADIPEX-P) 37.5 MG tablet Take 1 tablet (37.5 mg total) by mouth daily before breakfast. (Patient not taking: Reported on 01/06/2023)   No facility-administered encounter medications on file as of 01/06/2023.    Past Medical History:  Diagnosis Date   Personal history of asthma     Past Surgical History:  Procedure Laterality Date   TONSILLECTOMY      Family History  Problem Relation Age of Onset   Migraines Father        severe & give stroke like symptoms   Diabetes Paternal Grandmother    Heart disease Paternal Grandmother    Diabetes Paternal Grandfather    Heart disease Paternal Grandfather     Social History   Socioeconomic History   Marital status: Married    Spouse name: Not on file   Number of children: Not on file   Years of education: Not on file   Highest education level: Some college, no degree  Occupational History   Not on file  Tobacco Use   Smoking status: Never   Smokeless tobacco: Current    Types: Snuff  Substance and Sexual Activity   Alcohol use: Yes    Comment: Occassional   Drug use: Never   Sexual activity: Yes  Other Topics Concern   Not on file  Social History Narrative   Not on file   Social Determinants of Health   Financial Resource Strain: Low Risk  (01/02/2023)   Overall Financial Resource Strain (CARDIA)    Difficulty of Paying Living Expenses: Not hard at all  Food Insecurity: No  Food Insecurity (01/02/2023)   Hunger Vital Sign    Worried About Running Out of Food in the Last Year: Never true    Ran Out of Food in the Last Year: Never true  Transportation Needs: No Transportation Needs (01/02/2023)   PRAPARE - Administrator, Civil Service (Medical): No    Lack of Transportation (Non-Medical): No  Physical Activity: Insufficiently Active (01/02/2023)   Exercise Vital Sign    Days of Exercise per Week: 2 days    Minutes of Exercise per Session: 30 min  Stress: No Stress Concern Present (01/02/2023)   [employer] of Occupational Health - Occupational Stress Questionnaire    Feeling of Stress : Not at all  Social Connections: Moderately Integrated (01/02/2023)   Social Connection and Isolation Panel [NHANES]    Frequency of Communication with Friends and Family: Once a week    Frequency of Social Gatherings with Friends and Family: Once a week    Attends Religious Services: More than 4 times per year    Active Member of [employer] or Organizations: Yes    Attends Banker Meetings: Never    Marital Status: Married  Catering manager Violence: Not on file    Review of Systems  All other systems reviewed and are negative.       Objective    BP 121/81   Pulse 70   Temp 98.1 F (36.7 C)   Resp (!) 95   Wt 294 lb (133.4 kg)   SpO2 98%   BMI 39.87 kg/m   Physical Exam Vitals and nursing note reviewed.  Constitutional:      General: He is not in acute distress.    Appearance: He is obese.  Cardiovascular:     Rate and Rhythm: Normal rate and regular rhythm.  Pulmonary:     Effort: Pulmonary effort is normal.     Breath sounds: Normal breath sounds.  Abdominal:     Palpations: Abdomen is soft.     Tenderness: There is no abdominal tenderness.  Neurological:     General: No focal deficit present.     Mental Status: He is alert and oriented to person, place, and time.         Assessment & Plan:  1. Encounter for weight management Phentermine refilled. Goal is 3-5lbs/mo wt loss.   2. Class 2 obesity due to excess calories without serious comorbidity with body mass index (BMI) of 39.0 to 39.9 in adult  - phentermine (ADIPEX-P) 37.5 MG tablet; Take 1 tablet (37.5 mg total) by mouth daily before breakfast.  Dispense: 30 tablet; Refill: 0   Return in about 4 weeks (around 02/03/2023) for follow up.   Tommie Raymond, MD

## 2023-02-10 ENCOUNTER — Ambulatory Visit: Payer: 59 | Admitting: Family Medicine

## 2023-02-10 ENCOUNTER — Encounter: Payer: Self-pay | Admitting: Family Medicine

## 2023-02-10 VITALS — BP 138/87 | HR 80 | Temp 98.1°F | Resp 16 | Wt 300.8 lb

## 2023-02-10 DIAGNOSIS — Z7689 Persons encountering health services in other specified circumstances: Secondary | ICD-10-CM

## 2023-02-10 DIAGNOSIS — Z6841 Body Mass Index (BMI) 40.0 and over, adult: Secondary | ICD-10-CM | POA: Diagnosis not present

## 2023-02-10 MED ORDER — WEGOVY 0.25 MG/0.5ML ~~LOC~~ SOAJ: 0.2500 mg | SUBCUTANEOUS | 0 refills | Status: DC

## 2023-02-10 NOTE — Progress Notes (Unsigned)
Patient came in for monthly weight check. Patient has no other concerns today  

## 2023-02-14 ENCOUNTER — Encounter: Payer: Self-pay | Admitting: Family Medicine

## 2023-02-14 NOTE — Progress Notes (Signed)
Established Patient Office Visit  Subjective    Patient ID: Derek Fernandez, male    DOB: Jan 01, 1991  Age: 32 y.o. MRN: [id number]  CC: No chief complaint on file.   HPI Derek Fernandez presents for weight management. Denies acute complaints or concerns.    Outpatient Encounter Medications as of 02/10/2023  Medication Sig   Semaglutide-Weight Management (WEGOVY) 0.25 MG/0.5ML SOAJ Inject 0.25 mg into the skin once a week.   chlorpheniramine (CHLOR-TRIMETON) 4 MG tablet 1 tablet as needed   cyclobenzaprine (FLEXERIL) 10 MG tablet Take 1 tablet (10 mg total) by mouth 3 (three) times daily as needed for muscle spasms. (Patient not taking: Reported on 01/06/2023)   fluticasone (FLONASE) 50 MCG/ACT nasal spray Place 2 sprays into both nostrils daily. (Patient not taking: Reported on 01/06/2023)   hyoscyamine (LEVSIN SL) 0.125 MG SL tablet 1 tablet as needed (Patient not taking: Reported on 01/06/2023)   ibuprofen (ADVIL) 800 MG tablet Take 800 mg by mouth 3 (three) times daily. (Patient not taking: Reported on 01/06/2023)   ketoconazole (NIZORAL) 2 % cream Apply 1 Application topically daily. (Patient not taking: Reported on 01/06/2023)   levocetirizine (XYZAL) 5 MG tablet 1 tablet in the evening Orally Once a day for 30 day(s) (Patient not taking: Reported on 01/06/2023)   mupirocin ointment (BACTROBAN) 2 % Apply topically 2 (two) times daily. (Patient not taking: Reported on 01/06/2023)   ondansetron (ZOFRAN-ODT) 4 MG disintegrating tablet Take 1 tablet (4 mg total) by mouth every 8 (eight) hours as needed for nausea or vomiting. (Patient not taking: Reported on 01/06/2023)   phentermine (ADIPEX-P) 37.5 MG tablet Take 1 tablet (37.5 mg total) by mouth daily before breakfast.   STATUS COVID-19/FLU A&B KIT TEST AS DIRECTED TODAY (Patient not taking: Reported on 01/06/2023)   sulfamethoxazole-trimethoprim (BACTRIM DS) 800-160 MG tablet Take 1 tablet by mouth 2 (two) times daily. (Patient not taking:  Reported on 01/06/2023)   No facility-administered encounter medications on file as of 02/10/2023.    Past Medical History:  Diagnosis Date   Personal history of asthma     Past Surgical History:  Procedure Laterality Date   TONSILLECTOMY      Family History  Problem Relation Age of Onset   Migraines Father        severe & give stroke like symptoms   Diabetes Paternal Grandmother    Heart disease Paternal Grandmother    Diabetes Paternal Grandfather    Heart disease Paternal Grandfather     Social History   Socioeconomic History   Marital status: Married    Spouse name: Not on file   Number of children: Not on file   Years of education: Not on file   Highest education level: Some college, no degree  Occupational History   Not on file  Tobacco Use   Smoking status: Never   Smokeless tobacco: Current    Types: Snuff  Substance and Sexual Activity   Alcohol use: Yes    Comment: Occassional   Drug use: Never   Sexual activity: Yes  Other Topics Concern   Not on file  Social History Narrative   Not on file   Social Determinants of Health   Financial Resource Strain: Low Risk  (01/02/2023)   Overall Financial Resource Strain (CARDIA)    Difficulty of Paying Living Expenses: Not hard at all  Food Insecurity: No Food Insecurity (01/02/2023)   Hunger Vital Sign    Worried About Running Out of Food  in the Last Year: Never true    Ran Out of Food in the Last Year: Never true  Transportation Needs: No Transportation Needs (01/02/2023)   PRAPARE - Administrator, Civil Service (Medical): No    Lack of Transportation (Non-Medical): No  Physical Activity: Insufficiently Active (01/02/2023)   Exercise Vital Sign    Days of Exercise per Week: 2 days    Minutes of Exercise per Session: 30 min  Stress: No Stress Concern Present (01/02/2023)   [employer] of Occupational Health - Occupational Stress Questionnaire    Feeling of Stress : Not at all  Social  Connections: Moderately Integrated (01/02/2023)   Social Connection and Isolation Panel [NHANES]    Frequency of Communication with Friends and Family: Once a week    Frequency of Social Gatherings with Friends and Family: Once a week    Attends Religious Services: More than 4 times per year    Active Member of [employer] or Organizations: Yes    Attends Banker Meetings: Never    Marital Status: Married  Catering manager Violence: Not on file    Review of Systems  All other systems reviewed and are negative.       Objective    BP 138/87   Pulse 80   Temp 98.1 F (36.7 C) (Oral)   Resp 16   Wt (!) 300 lb 12.8 oz (136.4 kg)   SpO2 96%   BMI 40.80 kg/m   Physical Exam Vitals and nursing note reviewed.  Constitutional:      General: He is not in acute distress.    Appearance: He is obese.  Cardiovascular:     Rate and Rhythm: Normal rate and regular rhythm.  Pulmonary:     Effort: Pulmonary effort is normal.     Breath sounds: Normal breath sounds.  Abdominal:     Palpations: Abdomen is soft.     Tenderness: There is no abdominal tenderness.  Neurological:     General: No focal deficit present.     Mental Status: He is alert and oriented to person, place, and time.         Assessment & Plan:   1. Encounter for weight management Discussed compliance. Patient would like to switch to wegovy. [facility] prescribed.   2. Class 3 severe obesity due to excess calories without serious comorbidity with body mass index (BMI) of 40.0 to 44.9 in adult [facility])   Return in about 4 weeks (around 03/10/2023) for follow up.   Tommie Raymond, MD

## 2023-03-13 ENCOUNTER — Ambulatory Visit: Payer: 59 | Admitting: Family Medicine

## 2023-03-13 DIAGNOSIS — Z6841 Body Mass Index (BMI) 40.0 and over, adult: Secondary | ICD-10-CM | POA: Diagnosis not present

## 2023-03-13 DIAGNOSIS — Z7689 Persons encountering health services in other specified circumstances: Secondary | ICD-10-CM | POA: Diagnosis not present

## 2023-03-13 MED ORDER — WEGOVY 0.5 MG/0.5ML ~~LOC~~ SOAJ: 0.5000 mg | SUBCUTANEOUS | 0 refills | Status: DC

## 2023-03-13 NOTE — Progress Notes (Unsigned)
Patient came in for monthly weight check. Patient has no other concerns today

## 2023-03-15 ENCOUNTER — Encounter: Payer: Self-pay | Admitting: Family Medicine

## 2023-03-15 NOTE — Progress Notes (Signed)
Established Patient Office Visit  Subjective    Patient ID: Derek Fernandez, male    DOB: Oct 19, 1990  Age: 32 y.o. MRN: [id number]  CC: No chief complaint on file.   HPI Derek Fernandez presents for routine weight management.    Outpatient Encounter Medications as of 03/13/2023  Medication Sig   Semaglutide-Weight Management (WEGOVY) 0.5 MG/0.5ML SOAJ Inject 0.5 mg into the skin once a week.   chlorpheniramine (CHLOR-TRIMETON) 4 MG tablet 1 tablet as needed   cyclobenzaprine (FLEXERIL) 10 MG tablet Take 1 tablet (10 mg total) by mouth 3 (three) times daily as needed for muscle spasms. (Patient not taking: Reported on 01/06/2023)   fluticasone (FLONASE) 50 MCG/ACT nasal spray Place 2 sprays into both nostrils daily. (Patient not taking: Reported on 01/06/2023)   hyoscyamine (LEVSIN SL) 0.125 MG SL tablet 1 tablet as needed (Patient not taking: Reported on 01/06/2023)   ibuprofen (ADVIL) 800 MG tablet Take 800 mg by mouth 3 (three) times daily. (Patient not taking: Reported on 01/06/2023)   ketoconazole (NIZORAL) 2 % cream Apply 1 Application topically daily. (Patient not taking: Reported on 01/06/2023)   levocetirizine (XYZAL) 5 MG tablet 1 tablet in the evening Orally Once a day for 30 day(s) (Patient not taking: Reported on 01/06/2023)   mupirocin ointment (BACTROBAN) 2 % Apply topically 2 (two) times daily. (Patient not taking: Reported on 01/06/2023)   ondansetron (ZOFRAN-ODT) 4 MG disintegrating tablet Take 1 tablet (4 mg total) by mouth every 8 (eight) hours as needed for nausea or vomiting. (Patient not taking: Reported on 01/06/2023)   phentermine (ADIPEX-P) 37.5 MG tablet Take 1 tablet (37.5 mg total) by mouth daily before breakfast.   STATUS COVID-19/FLU A&B KIT TEST AS DIRECTED TODAY (Patient not taking: Reported on 01/06/2023)   [DISCONTINUED] Semaglutide-Weight Management (WEGOVY) 0.25 MG/0.5ML SOAJ Inject 0.25 mg into the skin once a week.   No facility-administered encounter  medications on file as of 03/13/2023.    Past Medical History:  Diagnosis Date   Personal history of asthma     Past Surgical History:  Procedure Laterality Date   TONSILLECTOMY      Family History  Problem Relation Age of Onset   Migraines Father        severe & give stroke like symptoms   Diabetes Paternal Grandmother    Heart disease Paternal Grandmother    Diabetes Paternal Grandfather    Heart disease Paternal Grandfather     Social History   Socioeconomic History   Marital status: Married    Spouse name: Not on file   Number of children: Not on file   Years of education: Not on file   Highest education level: Some college, no degree  Occupational History   Not on file  Tobacco Use   Smoking status: Never   Smokeless tobacco: Current    Types: Snuff  Substance and Sexual Activity   Alcohol use: Yes    Comment: Occassional   Drug use: Never   Sexual activity: Yes  Other Topics Concern   Not on file  Social History Narrative   Not on file   Social Determinants of Health   Financial Resource Strain: Low Risk  (01/02/2023)   Overall Financial Resource Strain (CARDIA)    Difficulty of Paying Living Expenses: Not hard at all  Food Insecurity: No Food Insecurity (01/02/2023)   Hunger Vital Sign    Worried About Running Out of Food in the Last Year: Never true  Ran Out of Food in the Last Year: Never true  Transportation Needs: No Transportation Needs (01/02/2023)   PRAPARE - Administrator, Civil Service (Medical): No    Lack of Transportation (Non-Medical): No  Physical Activity: Insufficiently Active (01/02/2023)   Exercise Vital Sign    Days of Exercise per Week: 2 days    Minutes of Exercise per Session: 30 min  Stress: No Stress Concern Present (01/02/2023)   [employer] of Occupational Health - Occupational Stress Questionnaire    Feeling of Stress : Not at all  Social Connections: Moderately Integrated (01/02/2023)   Social  Connection and Isolation Panel [NHANES]    Frequency of Communication with Friends and Family: Once a week    Frequency of Social Gatherings with Friends and Family: Once a week    Attends Religious Services: More than 4 times per year    Active Member of [employer] or Organizations: Yes    Attends Banker Meetings: Never    Marital Status: Married  Catering manager Violence: Not on file    Review of Systems  All other systems reviewed and are negative.       Objective    There were no vitals taken for this visit.  Physical Exam Vitals and nursing note reviewed.  Constitutional:      General: He is not in acute distress.    Appearance: He is obese.  Cardiovascular:     Rate and Rhythm: Normal rate and regular rhythm.  Pulmonary:     Effort: Pulmonary effort is normal.     Breath sounds: Normal breath sounds.  Abdominal:     Palpations: Abdomen is soft.     Tenderness: There is no abdominal tenderness.  Neurological:     General: No focal deficit present.     Mental Status: He is alert and oriented to person, place, and time.         Assessment & Plan:   1. Encounter for weight management Patient was on a cruise and did not do well about his diet. Discussed compliance. Phentermine refilled.  Goal is 5-7lbs/mo wt loss.   2. Class 3 severe obesity due to excess calories without serious comorbidity with body mass index (BMI) of 40.0 to 44.9 in adult [facility])     Return in about 4 weeks (around 04/10/2023).   Tommie Raymond, MD

## 2023-04-11 ENCOUNTER — Ambulatory Visit: Payer: 59 | Admitting: Family Medicine

## 2023-04-11 ENCOUNTER — Encounter: Payer: Self-pay | Admitting: Family Medicine

## 2023-04-11 VITALS — BP 137/94 | HR 97 | Temp 98.4°F | Resp 18 | Ht 72.0 in | Wt 288.6 lb

## 2023-04-11 DIAGNOSIS — Z6839 Body mass index (BMI) 39.0-39.9, adult: Secondary | ICD-10-CM

## 2023-04-11 DIAGNOSIS — E6609 Other obesity due to excess calories: Secondary | ICD-10-CM

## 2023-04-11 DIAGNOSIS — Z7689 Persons encountering health services in other specified circumstances: Secondary | ICD-10-CM

## 2023-04-11 MED ORDER — WEGOVY 0.5 MG/0.5ML ~~LOC~~ SOAJ: 0.5000 mg | SUBCUTANEOUS | 0 refills | Status: DC

## 2023-04-11 MED ORDER — WEGOVY 1 MG/0.5ML ~~LOC~~ SOAJ: 1.0000 mg | SUBCUTANEOUS | 0 refills | Status: DC

## 2023-04-11 NOTE — Progress Notes (Unsigned)
Patient came in for monthly weight check. Patient has no other concerns today

## 2023-04-12 ENCOUNTER — Encounter: Payer: Self-pay | Admitting: Family Medicine

## 2023-04-13 ENCOUNTER — Encounter: Payer: Self-pay | Admitting: Family Medicine

## 2023-04-13 NOTE — Progress Notes (Signed)
Established Patient Office Visit  Subjective    Patient ID: Derek Fernandez, male    DOB: 1991/06/30  Age: 32 y.o. MRN: [id number]  CC: No chief complaint on file.   HPI Derek Fernandez presents for weight management. Patient denies acute complaints or concerns.    Outpatient Encounter Medications as of 04/11/2023  Medication Sig   Semaglutide-Weight Management (WEGOVY) 1 MG/0.5ML SOAJ Inject 1 mg into the skin once a week.   [DISCONTINUED] Semaglutide-Weight Management (WEGOVY) 0.5 MG/0.5ML SOAJ Inject 0.5 mg into the skin once a week.   chlorpheniramine (CHLOR-TRIMETON) 4 MG tablet 1 tablet as needed (Patient not taking: Reported on 04/11/2023)   ibuprofen (ADVIL) 800 MG tablet Take 800 mg by mouth 3 (three) times daily. (Patient not taking: Reported on 01/06/2023)   ondansetron (ZOFRAN-ODT) 4 MG disintegrating tablet Take 1 tablet (4 mg total) by mouth every 8 (eight) hours as needed for nausea or vomiting. (Patient not taking: Reported on 01/06/2023)   phentermine (ADIPEX-P) 37.5 MG tablet Take 1 tablet (37.5 mg total) by mouth daily before breakfast. (Patient not taking: Reported on 04/11/2023)   Semaglutide-Weight Management (WEGOVY) 0.5 MG/0.5ML SOAJ Inject 0.5 mg into the skin once a week.   STATUS COVID-19/FLU A&B KIT TEST AS DIRECTED TODAY (Patient not taking: Reported on 01/06/2023)   [DISCONTINUED] cyclobenzaprine (FLEXERIL) 10 MG tablet Take 1 tablet (10 mg total) by mouth 3 (three) times daily as needed for muscle spasms. (Patient not taking: Reported on 01/06/2023)   [DISCONTINUED] fluticasone (FLONASE) 50 MCG/ACT nasal spray Place 2 sprays into both nostrils daily. (Patient not taking: Reported on 01/06/2023)   [DISCONTINUED] hyoscyamine (LEVSIN SL) 0.125 MG SL tablet 1 tablet as needed (Patient not taking: Reported on 01/06/2023)   [DISCONTINUED] ketoconazole (NIZORAL) 2 % cream Apply 1 Application topically daily. (Patient not taking: Reported on 01/06/2023)   [DISCONTINUED]  levocetirizine (XYZAL) 5 MG tablet 1 tablet in the evening Orally Once a day for 30 day(s) (Patient not taking: Reported on 01/06/2023)   [DISCONTINUED] mupirocin ointment (BACTROBAN) 2 % Apply topically 2 (two) times daily. (Patient not taking: Reported on 01/06/2023)   No facility-administered encounter medications on file as of 04/11/2023.    Past Medical History:  Diagnosis Date   Personal history of asthma     Past Surgical History:  Procedure Laterality Date   TONSILLECTOMY      Family History  Problem Relation Age of Onset   Migraines Father        severe & give stroke like symptoms   Diabetes Paternal Grandmother    Heart disease Paternal Grandmother    Diabetes Paternal Grandfather    Heart disease Paternal Grandfather     Social History   Socioeconomic History   Marital status: Married    Spouse name: Not on file   Number of children: Not on file   Years of education: Not on file   Highest education level: Some college, no degree  Occupational History   Not on file  Tobacco Use   Smoking status: Never   Smokeless tobacco: Current    Types: Snuff  Substance and Sexual Activity   Alcohol use: Yes    Comment: Occassional   Drug use: Never   Sexual activity: Yes  Other Topics Concern   Not on file  Social History Narrative   Not on file   Social Determinants of Health   Financial Resource Strain: Low Risk  (01/02/2023)   Overall Financial Resource Strain (CARDIA)  Difficulty of Paying Living Expenses: Not hard at all  Food Insecurity: No Food Insecurity (01/02/2023)   Hunger Vital Sign    Worried About Running Out of Food in the Last Year: Never true    Ran Out of Food in the Last Year: Never true  Transportation Needs: No Transportation Needs (01/02/2023)   PRAPARE - Administrator, Civil Service (Medical): No    Lack of Transportation (Non-Medical): No  Physical Activity: Insufficiently Active (01/02/2023)   Exercise Vital Sign    Days of  Exercise per Week: 2 days    Minutes of Exercise per Session: 30 min  Stress: No Stress Concern Present (01/02/2023)   [employer] of Occupational Health - Occupational Stress Questionnaire    Feeling of Stress : Not at all  Social Connections: Moderately Integrated (01/02/2023)   Social Connection and Isolation Panel [NHANES]    Frequency of Communication with Friends and Family: Once a week    Frequency of Social Gatherings with Friends and Family: Once a week    Attends Religious Services: More than 4 times per year    Active Member of [employer] or Organizations: Yes    Attends Banker Meetings: Never    Marital Status: Married  Catering manager Violence: Not on file    Review of Systems  All other systems reviewed and are negative.       Objective    BP (!) 137/94 (BP Location: Right Arm, Patient Position: Sitting, Cuff Size: Large)   Pulse 97   Temp 98.4 F (36.9 C) (Oral)   Resp 18   Ht 6' (1.829 m)   Wt 288 lb 9.6 oz (130.9 kg)   SpO2 96%   BMI 39.14 kg/m   Physical Exam Vitals and nursing note reviewed.  Constitutional:      General: He is not in acute distress.    Appearance: He is obese.  Cardiovascular:     Rate and Rhythm: Normal rate and regular rhythm.  Pulmonary:     Effort: Pulmonary effort is normal.     Breath sounds: Normal breath sounds.  Abdominal:     Palpations: Abdomen is soft.     Tenderness: There is no abdominal tenderness.  Neurological:     General: No focal deficit present.     Mental Status: He is alert and oriented to person, place, and time.         Assessment & Plan:   1. Encounter for weight management Doing well with present management. Refill meds.   2. Class 2 obesity due to excess calories without serious comorbidity with body mass index (BMI) of 39.0 to 39.9 in adult     Return in about 4 weeks (around 05/09/2023) for follow up.   Tommie Raymond, MD

## 2023-05-03 ENCOUNTER — Telehealth: Payer: 59 | Admitting: Physician Assistant

## 2023-05-03 DIAGNOSIS — A084 Viral intestinal infection, unspecified: Secondary | ICD-10-CM | POA: Diagnosis not present

## 2023-05-03 MED ORDER — ONDANSETRON 4 MG PO TBDP: 4.0000 mg | ORAL_TABLET | Freq: Three times a day (TID) | ORAL | 0 refills | Status: DC | PRN

## 2023-05-03 NOTE — Progress Notes (Signed)
I have spent 5 minutes in review of e-visit questionnaire, review and updating patient chart, medical decision making and response to patient.   Mia Milan Cody Jacklynn Dehaas, PA-C    

## 2023-05-03 NOTE — Progress Notes (Signed)
E-Visit for Diarrhea  We are sorry that you are not feeling well.  Here is how we plan to help!  Based on what you have shared with me it looks like you have Acute Infectious Diarrhea.  Most cases of acute diarrhea are due to infections with virus and bacteria and are self-limited conditions lasting less than 14 days.  For your symptoms you may take Imodium 2 mg tablets that are over the counter at your local pharmacy. Take two tablet now and then one after each loose stool up to 6 a day.  Antibiotics are not needed for most people with diarrhea.  I have prescribed Zofran 4 mg 1 tablet every 8 hours as needed for nausea and vomiting  HOME CARE We recommend changing your diet to help with your symptoms for the next few days. Drink plenty of fluids that contain water salt and sugar. Sports drinks such as Gatorade may help.  You may try broths, soups, bananas, applesauce, soft breads, mashed potatoes or crackers.  You are considered infectious for as long as the diarrhea continues. Hand washing or use of alcohol based hand sanitizers is recommend. It is best to stay out of work or school until your symptoms stop.   GET HELP RIGHT AWAY If you have dark yellow colored urine or do not pass urine frequently you should drink more fluids.   If your symptoms worsen  If you feel like you are going to pass out (faint) You have a new problem  MAKE SURE YOU  Understand these instructions. Will watch your condition. Will get help right away if you are not doing well or get worse.  Thank you for choosing an e-visit.  Your e-visit answers were reviewed by a board certified advanced clinical practitioner to complete your personal care plan. Depending upon the condition, your plan could have included both over the counter or prescription medications.  Please review your pharmacy choice. Make sure the pharmacy is open so you can pick up prescription now. If there is a problem, you may contact your  provider through [employer] and have the prescription routed to another pharmacy.  Your safety is important to [place]. If you have drug allergies check your prescription carefully.   For the next 24 hours you can use MyChart to ask questions about today's visit, request a non-urgent call back, or ask for a work or school excuse. You will get an email in the next two days asking about your experience. I hope that your e-visit has been valuable and will speed your recovery.

## 2023-05-11 ENCOUNTER — Encounter: Payer: Self-pay | Admitting: Family Medicine

## 2023-05-11 ENCOUNTER — Ambulatory Visit: Payer: 59 | Admitting: Family Medicine

## 2023-05-11 VITALS — BP 146/82 | HR 85 | Temp 98.1°F | Resp 16 | Wt 292.6 lb

## 2023-05-11 DIAGNOSIS — Z7689 Persons encountering health services in other specified circumstances: Secondary | ICD-10-CM

## 2023-05-11 DIAGNOSIS — E6609 Other obesity due to excess calories: Secondary | ICD-10-CM | POA: Diagnosis not present

## 2023-05-11 DIAGNOSIS — Z6839 Body mass index (BMI) 39.0-39.9, adult: Secondary | ICD-10-CM

## 2023-05-11 MED ORDER — WEGOVY 1 MG/0.5ML ~~LOC~~ SOAJ: 1.0000 mg | SUBCUTANEOUS | 0 refills | Status: DC

## 2023-05-11 NOTE — Progress Notes (Signed)
Established Patient Office Visit  Subjective    Patient ID: KRUZ GARR, male    DOB: 30-Mar-1991  Age: 32 y.o. MRN: [id number]  CC: No chief complaint on file.   HPI Derek Fernandez presents for weight management. Patient denies acute complaints or concerns.   Outpatient Encounter Medications as of 05/11/2023  Medication Sig   chlorpheniramine (CHLOR-TRIMETON) 4 MG tablet 1 tablet as needed (Patient not taking: Reported on 04/11/2023)   ibuprofen (ADVIL) 800 MG tablet Take 800 mg by mouth 3 (three) times daily. (Patient not taking: Reported on 01/06/2023)   ondansetron (ZOFRAN-ODT) 4 MG disintegrating tablet Take 1 tablet (4 mg total) by mouth every 8 (eight) hours as needed for nausea or vomiting.   phentermine (ADIPEX-P) 37.5 MG tablet Take 1 tablet (37.5 mg total) by mouth daily before breakfast. (Patient not taking: Reported on 04/11/2023)   Semaglutide-Weight Management (WEGOVY) 0.5 MG/0.5ML SOAJ Inject 0.5 mg into the skin once a week.   Semaglutide-Weight Management (WEGOVY) 1 MG/0.5ML SOAJ Inject 1 mg into the skin once a week.   STATUS COVID-19/FLU A&B KIT TEST AS DIRECTED TODAY (Patient not taking: Reported on 01/06/2023)   No facility-administered encounter medications on file as of 05/11/2023.    Past Medical History:  Diagnosis Date   Personal history of asthma     Past Surgical History:  Procedure Laterality Date   TONSILLECTOMY      Family History  Problem Relation Age of Onset   Migraines Father        severe & give stroke like symptoms   Diabetes Paternal Grandmother    Heart disease Paternal Grandmother    Diabetes Paternal Grandfather    Heart disease Paternal Grandfather     Social History   Socioeconomic History   Marital status: Married    Spouse name: Not on file   Number of children: Not on file   Years of education: Not on file   Highest education level: Some college, no degree  Occupational History   Not on file  Tobacco Use    Smoking status: Never   Smokeless tobacco: Current    Types: Snuff  Substance and Sexual Activity   Alcohol use: Yes    Comment: Occassional   Drug use: Never   Sexual activity: Yes  Other Topics Concern   Not on file  Social History Narrative   Not on file   Social Determinants of Health   Financial Resource Strain: Low Risk  (01/02/2023)   Overall Financial Resource Strain (CARDIA)    Difficulty of Paying Living Expenses: Not hard at all  Food Insecurity: No Food Insecurity (01/02/2023)   Hunger Vital Sign    Worried About Running Out of Food in the Last Year: Never true    Ran Out of Food in the Last Year: Never true  Transportation Needs: No Transportation Needs (01/02/2023)   PRAPARE - Administrator, Civil Service (Medical): No    Lack of Transportation (Non-Medical): No  Physical Activity: Insufficiently Active (01/02/2023)   Exercise Vital Sign    Days of Exercise per Week: 2 days    Minutes of Exercise per Session: 30 min  Stress: No Stress Concern Present (01/02/2023)   [employer] of Occupational Health - Occupational Stress Questionnaire    Feeling of Stress : Not at all  Social Connections: Moderately Integrated (01/02/2023)   Social Connection and Isolation Panel [NHANES]    Frequency of Communication with Friends and Family: Once a  week    Frequency of Social Gatherings with Friends and Family: Once a week    Attends Religious Services: More than 4 times per year    Active Member of [employer] or Organizations: Yes    Attends Banker Meetings: Never    Marital Status: Married  Catering manager Violence: Not on file    Review of Systems  All other systems reviewed and are negative.       Objective    BP (!) 146/82   Pulse 85   Temp 98.1 F (36.7 C) (Oral)   Resp 16   Wt 292 lb 9.6 oz (132.7 kg)   SpO2 97%   BMI 39.68 kg/m   Physical Exam Vitals and nursing note reviewed.  Constitutional:      General: He is not in  acute distress.    Appearance: He is obese.  Cardiovascular:     Rate and Rhythm: Normal rate and regular rhythm.  Pulmonary:     Effort: Pulmonary effort is normal.     Breath sounds: Normal breath sounds.  Abdominal:     Palpations: Abdomen is soft.     Tenderness: There is no abdominal tenderness.  Neurological:     General: No focal deficit present.     Mental Status: He is alert and oriented to person, place, and time.         Assessment & Plan:  1. Encounter for weight management Patient had reduced wegovy dose to 0.25 - will return to 1 mg dosing  2. Class 2 obesity due to excess calories without serious comorbidity with body mass index (BMI) of 39.0 to 39.9 in adult      No follow-ups on file.   Tommie Raymond, MD

## 2023-06-16 ENCOUNTER — Ambulatory Visit: Payer: 59 | Admitting: Family Medicine

## 2023-06-16 ENCOUNTER — Encounter: Payer: Self-pay | Admitting: Family Medicine

## 2023-06-16 VITALS — BP 107/74 | HR 68 | Temp 98.1°F | Resp 18 | Ht 72.0 in | Wt 291.4 lb

## 2023-06-16 DIAGNOSIS — E66812 Obesity, class 2: Secondary | ICD-10-CM | POA: Diagnosis not present

## 2023-06-16 DIAGNOSIS — Z7689 Persons encountering health services in other specified circumstances: Secondary | ICD-10-CM

## 2023-06-16 DIAGNOSIS — G2581 Restless legs syndrome: Secondary | ICD-10-CM

## 2023-06-16 DIAGNOSIS — Z6839 Body mass index (BMI) 39.0-39.9, adult: Secondary | ICD-10-CM | POA: Diagnosis not present

## 2023-06-16 DIAGNOSIS — E6609 Other obesity due to excess calories: Secondary | ICD-10-CM | POA: Diagnosis not present

## 2023-06-16 MED ORDER — WEGOVY 1 MG/0.5ML ~~LOC~~ SOAJ: 1.0000 mg | SUBCUTANEOUS | 0 refills | Status: DC

## 2023-06-16 NOTE — Progress Notes (Signed)
Established Patient Office Visit  Subjective    Patient ID: Derek Fernandez, male    DOB: 18-Feb-1991  Age: 32 y.o. MRN: [id number]  CC:  Chief Complaint  Patient presents with   Follow-up    Legs restless when going to bed    HPI Derek Fernandez presents for follow up for weight management. Patient also reports some intermittent restless legs at night  but has been using OTC prep that has resolved the sx.   Outpatient Encounter Medications as of 06/16/2023  Medication Sig   chlorpheniramine (CHLOR-TRIMETON) 4 MG tablet 1 tablet as needed (Patient not taking: Reported on 04/11/2023)   ibuprofen (ADVIL) 800 MG tablet Take 800 mg by mouth 3 (three) times daily. (Patient not taking: Reported on 01/06/2023)   ondansetron (ZOFRAN-ODT) 4 MG disintegrating tablet Take 1 tablet (4 mg total) by mouth every 8 (eight) hours as needed for nausea or vomiting.   Semaglutide-Weight Management (WEGOVY) 1 MG/0.5ML SOAJ Inject 1 mg into the skin once a week.   STATUS COVID-19/FLU A&B KIT TEST AS DIRECTED TODAY (Patient not taking: Reported on 01/06/2023)   [DISCONTINUED] phentermine (ADIPEX-P) 37.5 MG tablet Take 1 tablet (37.5 mg total) by mouth daily before breakfast. (Patient not taking: Reported on 04/11/2023)   [DISCONTINUED] Semaglutide-Weight Management (WEGOVY) 0.5 MG/0.5ML SOAJ Inject 0.5 mg into the skin once a week.   [DISCONTINUED] Semaglutide-Weight Management (WEGOVY) 1 MG/0.5ML SOAJ Inject 1 mg into the skin once a week.   No facility-administered encounter medications on file as of 06/16/2023.    Past Medical History:  Diagnosis Date   Personal history of asthma     Past Surgical History:  Procedure Laterality Date   TONSILLECTOMY      Family History  Problem Relation Age of Onset   Migraines Father        severe & give stroke like symptoms   Diabetes Paternal Grandmother    Heart disease Paternal Grandmother    Diabetes Paternal Grandfather    Heart disease Paternal  Grandfather     Social History   Socioeconomic History   Marital status: Married    Spouse name: Not on file   Number of children: Not on file   Years of education: Not on file   Highest education level: Some college, no degree  Occupational History   Not on file  Tobacco Use   Smoking status: Never   Smokeless tobacco: Former    Types: Snuff   Tobacco comments:    Patient quit a year ago  Substance and Sexual Activity   Alcohol use: Yes    Comment: Occassional   Drug use: Never   Sexual activity: Yes  Other Topics Concern   Not on file  Social History Narrative   Not on file   Social Determinants of Health   Financial Resource Strain: Low Risk  (06/15/2023)   Overall Financial Resource Strain (CARDIA)    Difficulty of Paying Living Expenses: Not very hard  Food Insecurity: No Food Insecurity (06/15/2023)   Hunger Vital Sign    Worried About Running Out of Food in the Last Year: Never true    Ran Out of Food in the Last Year: Never true  Transportation Needs: No Transportation Needs (06/15/2023)   PRAPARE - Administrator, Civil Service (Medical): No    Lack of Transportation (Non-Medical): No  Physical Activity: Sufficiently Active (06/16/2023)   Exercise Vital Sign    Days of Exercise per Week: 5 days  Minutes of Exercise per Session: 30 min  Recent Concern: Physical Activity - Insufficiently Active (06/15/2023)   Exercise Vital Sign    Days of Exercise per Week: 2 days    Minutes of Exercise per Session: 30 min  Stress: No Stress Concern Present (06/15/2023)   [employer] of Occupational Health - Occupational Stress Questionnaire    Feeling of Stress : Not at all  Social Connections: Socially Integrated (06/15/2023)   Social Connection and Isolation Panel [NHANES]    Frequency of Communication with Friends and Family: More than three times a week    Frequency of Social Gatherings with Friends and Family: Once a week    Attends Religious  Services: More than 4 times per year    Active Member of [employer] or Organizations: Yes    Attends Banker Meetings: Never    Marital Status: Married  Catering manager Violence: Not on file    Review of Systems  All other systems reviewed and are negative.       Objective    BP 107/74 (BP Location: Right Arm, Patient Position: Sitting, Cuff Size: Large)   Pulse 68   Temp 98.1 F (36.7 C) (Oral)   Resp 18   Ht 6' (1.829 m)   Wt 291 lb 6.4 oz (132.2 kg)   SpO2 96%   BMI 39.52 kg/m   Physical Exam Vitals and nursing note reviewed.  Constitutional:      General: He is not in acute distress.    Appearance: He is obese.  Cardiovascular:     Rate and Rhythm: Normal rate and regular rhythm.  Pulmonary:     Effort: Pulmonary effort is normal.     Breath sounds: Normal breath sounds.  Abdominal:     Palpations: Abdomen is soft.     Tenderness: There is no abdominal tenderness.  Neurological:     General: No focal deficit present.     Mental Status: He is alert and oriented to person, place, and time.         Assessment & Plan:  1. Encounter for weight management Refilled wegovy at present dose.   2. Class 2 obesity due to excess calories without serious comorbidity with body mass index (BMI) of 39.0 to 39.9 in adult   3. Restless leg Continue with OTC prn    Return in about 4 weeks (around 07/14/2023) for follow up.   Tommie Raymond, MD

## 2023-06-20 ENCOUNTER — Other Ambulatory Visit: Payer: Self-pay

## 2023-07-10 ENCOUNTER — Other Ambulatory Visit: Payer: Self-pay

## 2023-07-10 ENCOUNTER — Encounter: Payer: Self-pay | Admitting: Family Medicine

## 2023-07-10 NOTE — Telephone Encounter (Signed)
-----   Message from Weldon Picking sent at 07/10/2023 12:05 PM EST ----- Regarding: RE: PA This doesn't pertain to me. It looks like he needs a refill sent to the pharmacy so he can use his replacement voucher/fill from the manufacturer. ----- Message ----- From: Kieth Brightly, RMA Sent: 07/10/2023  11:54 AM EST To: Weldon Picking, CPhT Subject: FW: PA                                         Patient said: I had a faulty pen that the company sent me a voucher to get replaced. The pharmacy said they will have to send a new prescription request to you to replace the faulty pen so please don't deny the request. ----- Message ----- From: Weldon Picking, CPhT Sent: 06/20/2023  12:56 PM EST To: Kieth Brightly, RMA Subject: RE: PA                                         This has been approved. ----- Message ----- From: Kieth Brightly, RMA Sent: 06/19/2023   3:45 PM EST To: Weldon Picking, CPhT Subject: PA                                             Patient request PA for [phone]. Please and thank you

## 2023-07-17 ENCOUNTER — Telehealth: Payer: 59 | Admitting: Physician Assistant

## 2023-07-17 DIAGNOSIS — A084 Viral intestinal infection, unspecified: Secondary | ICD-10-CM | POA: Diagnosis not present

## 2023-07-17 MED ORDER — ONDANSETRON 4 MG PO TBDP
4.0000 mg | ORAL_TABLET | Freq: Three times a day (TID) | ORAL | 0 refills | Status: AC | PRN
Start: 2023-07-17 — End: ?

## 2023-07-17 NOTE — Progress Notes (Signed)

## 2023-07-17 NOTE — Progress Notes (Signed)
I have spent 5 minutes in review of e-visit questionnaire, review and updating patient chart, medical decision making and response to patient.   Mia Milan Cody Jacklynn Dehaas, PA-C    

## 2023-07-20 ENCOUNTER — Ambulatory Visit: Payer: 59 | Admitting: Family Medicine

## 2023-07-20 ENCOUNTER — Encounter: Payer: Self-pay | Admitting: Family Medicine

## 2023-07-20 VITALS — BP 127/86 | HR 67 | Temp 98.3°F | Resp 18 | Ht 72.0 in | Wt 288.0 lb

## 2023-07-20 DIAGNOSIS — K529 Noninfective gastroenteritis and colitis, unspecified: Secondary | ICD-10-CM

## 2023-07-20 MED ORDER — CIPROFLOXACIN HCL 500 MG PO TABS: 500.0000 mg | ORAL_TABLET | Freq: Two times a day (BID) | ORAL | 0 refills | Status: AC

## 2023-07-20 NOTE — Progress Notes (Signed)
Established Patient Office Visit  Subjective    Patient ID: Derek Fernandez, male    DOB: Jun 12, 1991  Age: 32 y.o. MRN: [id number]  CC:  Chief Complaint  Patient presents with   Follow-up    4 week, diarrhea, vomiting his dinner up in the am     HPI Derek Fernandez presents with complaint of diarrhea and intermittent nausea for about 1 week. He reports he came back from vacation about 4 days ago. Patient denies known contacts or exposures. Primarily in evening. Has ben rehydrating orally.   Outpatient Encounter Medications as of 07/20/2023  Medication Sig   ciprofloxacin (CIPRO) 500 MG tablet Take 1 tablet (500 mg total) by mouth 2 (two) times daily.   ondansetron (ZOFRAN-ODT) 4 MG disintegrating tablet Take 1 tablet (4 mg total) by mouth every 8 (eight) hours as needed for nausea or vomiting.   Semaglutide-Weight Management (WEGOVY) 1 MG/0.5ML SOAJ Inject 1 mg into the skin once a week.   chlorpheniramine (CHLOR-TRIMETON) 4 MG tablet 1 tablet as needed (Patient not taking: Reported on 04/11/2023)   ibuprofen (ADVIL) 800 MG tablet Take 800 mg by mouth 3 (three) times daily. (Patient not taking: Reported on 01/06/2023)   STATUS COVID-19/FLU A&B KIT TEST AS DIRECTED TODAY (Patient not taking: Reported on 01/06/2023)   No facility-administered encounter medications on file as of 07/20/2023.    Past Medical History:  Diagnosis Date   Personal history of asthma     Past Surgical History:  Procedure Laterality Date   TONSILLECTOMY      Family History  Problem Relation Age of Onset   Migraines Father        severe & give stroke like symptoms   Diabetes Paternal Grandmother    Heart disease Paternal Grandmother    Diabetes Paternal Grandfather    Heart disease Paternal Grandfather     Social History   Socioeconomic History   Marital status: Married    Spouse name: Not on file   Number of children: Not on file   Years of education: Not on file   Highest education level:  Some college, no degree  Occupational History   Not on file  Tobacco Use   Smoking status: Never   Smokeless tobacco: Former    Types: Snuff   Tobacco comments:    Patient quit a year ago  Substance and Sexual Activity   Alcohol use: Yes    Comment: Occassional   Drug use: Never   Sexual activity: Yes  Other Topics Concern   Not on file  Social History Narrative   Not on file   Social Determinants of Health   Financial Resource Strain: Low Risk  (06/15/2023)   Overall Financial Resource Strain (CARDIA)    Difficulty of Paying Living Expenses: Not very hard  Food Insecurity: No Food Insecurity (06/15/2023)   Hunger Vital Sign    Worried About Running Out of Food in the Last Year: Never true    Ran Out of Food in the Last Year: Never true  Transportation Needs: No Transportation Needs (06/15/2023)   PRAPARE - Administrator, Civil Service (Medical): No    Lack of Transportation (Non-Medical): No  Physical Activity: Sufficiently Active (06/16/2023)   Exercise Vital Sign    Days of Exercise per Week: 5 days    Minutes of Exercise per Session: 30 min  Recent Concern: Physical Activity - Insufficiently Active (06/15/2023)   Exercise Vital Sign    Days of  Exercise per Week: 2 days    Minutes of Exercise per Session: 30 min  Stress: No Stress Concern Present (06/15/2023)   [employer] of Occupational Health - Occupational Stress Questionnaire    Feeling of Stress : Not at all  Social Connections: Socially Integrated (06/15/2023)   Social Connection and Isolation Panel [NHANES]    Frequency of Communication with Friends and Family: More than three times a week    Frequency of Social Gatherings with Friends and Family: Once a week    Attends Religious Services: More than 4 times per year    Active Member of [employer] or Organizations: Yes    Attends Banker Meetings: Never    Marital Status: Married  Catering manager Violence: Not on file    Review  of Systems  All other systems reviewed and are negative.       Objective    BP 127/86 (BP Location: Right Arm, Patient Position: Sitting, Cuff Size: Normal)   Pulse 67   Temp 98.3 F (36.8 C)   Resp 18   Ht 6' (1.829 m)   Wt 288 lb (130.6 kg)   SpO2 97%   BMI 39.06 kg/m   Physical Exam Vitals and nursing note reviewed.  Constitutional:      General: He is not in acute distress.    Appearance: He is obese.  Cardiovascular:     Rate and Rhythm: Normal rate and regular rhythm.  Pulmonary:     Effort: Pulmonary effort is normal.     Breath sounds: Normal breath sounds.  Abdominal:     Palpations: Abdomen is soft.     Tenderness: There is no abdominal tenderness.  Neurological:     General: No focal deficit present.     Mental Status: He is alert and oriented to person, place, and time.         Assessment & Plan:   1. Noninfectious gastroenteritis, unspecified type Adequate fluids and hydration recommended. Cipro prescribed.  Return if symptoms worsen or fail to improve.   Tommie Raymond, MD

## 2023-07-24 ENCOUNTER — Encounter: Payer: Self-pay | Admitting: Family Medicine

## 2023-08-25 ENCOUNTER — Ambulatory Visit: Payer: 59 | Admitting: Family Medicine

## 2023-09-15 ENCOUNTER — Ambulatory Visit: Payer: 59 | Admitting: Family Medicine

## 2023-09-15 ENCOUNTER — Encounter: Payer: Self-pay | Admitting: Family Medicine

## 2023-09-15 VITALS — BP 134/86 | HR 83 | Temp 98.6°F | Resp 18 | Ht 72.0 in | Wt 299.2 lb

## 2023-09-15 DIAGNOSIS — M25571 Pain in right ankle and joints of right foot: Secondary | ICD-10-CM

## 2023-09-15 DIAGNOSIS — Z6841 Body Mass Index (BMI) 40.0 and over, adult: Secondary | ICD-10-CM

## 2023-09-15 DIAGNOSIS — E66813 Obesity, class 3: Secondary | ICD-10-CM | POA: Diagnosis not present

## 2023-09-15 DIAGNOSIS — Z7689 Persons encountering health services in other specified circumstances: Secondary | ICD-10-CM

## 2023-09-15 MED ORDER — TIRZEPATIDE-WEIGHT MANAGEMENT 2.5 MG/0.5ML ~~LOC~~ SOLN: 2.5000 mg | SUBCUTANEOUS | 0 refills | Status: DC

## 2023-09-15 NOTE — Progress Notes (Addendum)
 Established Patient Office Visit  Subjective    Patient ID: Derek Fernandez, male    DOB: [birth year]  Age: 33 y.o. MRN: [id number]  CC:  Chief Complaint  Patient presents with   Follow-up    Shooting pain in ankles    HPI Derek Fernandez presents for follow up of weight management. Patient reports he stopped wegovy 2/2 GI sx. Patient also reports right ankle pain primarily when barefooted in house.   Outpatient Encounter Medications as of 09/15/2023  Medication Sig   ondansetron (ZOFRAN-ODT) 4 MG disintegrating tablet Take 1 tablet (4 mg total) by mouth every 8 (eight) hours as needed for nausea or vomiting.   tirzepatide (ZEPBOUND) 2.5 MG/0.5ML injection vial Inject 2.5 mg into the skin once a week.   chlorpheniramine (CHLOR-TRIMETON) 4 MG tablet 1 tablet as needed (Patient not taking: Reported on 04/11/2023)   ciprofloxacin (CIPRO) 500 MG tablet Take 1 tablet (500 mg total) by mouth 2 (two) times daily. (Patient not taking: Reported on 09/15/2023)   ibuprofen (ADVIL) 800 MG tablet Take 800 mg by mouth 3 (three) times daily. (Patient not taking: Reported on 01/06/2023)   Semaglutide-Weight Management (WEGOVY) 1 MG/0.5ML SOAJ Inject 1 mg into the skin once a week. (Patient not taking: Reported on 09/15/2023)   STATUS COVID-19/FLU A&B KIT TEST AS DIRECTED TODAY (Patient not taking: Reported on 01/06/2023)   No facility-administered encounter medications on file as of 09/15/2023.    Past Medical History:  Diagnosis Date   Personal history of asthma     Past Surgical History:  Procedure Laterality Date   TONSILLECTOMY      Family History  Problem Relation Age of Onset   Migraines Father        severe & give stroke like symptoms   Diabetes Paternal Grandmother    Heart disease Paternal Grandmother    Diabetes Paternal Grandfather    Heart disease Paternal Grandfather     Social History   Socioeconomic History   Marital status: Married    Spouse name: Not on file    Number of children: Not on file   Years of education: Not on file   Highest education level: Some college, no degree  Occupational History   Not on file  Tobacco Use   Smoking status: Never   Smokeless tobacco: Former    Types: Snuff   Tobacco comments:    Patient quit a year ago  Substance and Sexual Activity   Alcohol use: Yes    Comment: Occassional   Drug use: Never   Sexual activity: Yes  Other Topics Concern   Not on file  Social History Narrative   Not on file   Social Drivers of Health   Financial Resource Strain: Low Risk  (09/11/2023)   Overall Financial Resource Strain (CARDIA)    Difficulty of Paying Living Expenses: Not very hard  Food Insecurity: Food Insecurity Present (09/11/2023)   Hunger Vital Sign    Worried About Running Out of Food in the Last Year: Sometimes true    Ran Out of Food in the Last Year: Never true  Transportation Needs: No Transportation Needs (09/11/2023)   PRAPARE - Administrator, Civil Service (Medical): No    Lack of Transportation (Non-Medical): No  Physical Activity: Insufficiently Active (09/11/2023)   Exercise Vital Sign    Days of Exercise per Week: 3 days    Minutes of Exercise per Session: 40 min  Stress: No Stress Concern Present (09/11/2023)  [employer] of Occupational Health - Occupational Stress Questionnaire    Feeling of Stress : Not at all  Social Connections: Socially Integrated (09/11/2023)   Social Connection and Isolation Panel [NHANES]    Frequency of Communication with Friends and Family: Twice a week    Frequency of Social Gatherings with Friends and Family: Once a week    Attends Religious Services: More than 4 times per year    Active Member of [employer] or Organizations: Yes    Attends Banker Meetings: Never    Marital Status: Married  Catering manager Violence: Not on file    Review of Systems  All other systems reviewed and are negative.       Objective    BP 134/86  (BP Location: Right Arm, Patient Position: Sitting, Cuff Size: Large)   Pulse 83   Temp 98.6 F (37 C) (Oral)   Resp 18   Ht 6' (1.829 m)   Wt 299 lb 3.2 oz (135.7 kg)   SpO2 93%   BMI 40.58 kg/m   Physical Exam Vitals and nursing note reviewed.  Constitutional:      General: He is not in acute distress.    Appearance: He is obese.  Cardiovascular:     Rate and Rhythm: Normal rate and regular rhythm.  Pulmonary:     Effort: Pulmonary effort is normal.     Breath sounds: Normal breath sounds.  Abdominal:     Palpations: Abdomen is soft.     Tenderness: There is no abdominal tenderness.  Neurological:     General: No focal deficit present.     Mental Status: He is alert and oriented to person, place, and time.         Assessment & Plan:   1. Encounter for weight management (Primary) Patient desires to try zepbound which was prescribed.   2. Class 3 severe obesity due to excess calories without serious comorbidity with body mass index (BMI) of 40.0 to 44.9 in adult (HCC)   3. Acute right ankle pain Exercises given. Utilize tylenol/nsaids prn    Return in about 4 weeks (around 10/13/2023).   Tommie Raymond, MD

## 2023-09-19 ENCOUNTER — Encounter: Payer: Self-pay | Admitting: Family Medicine

## 2023-09-19 ENCOUNTER — Other Ambulatory Visit: Payer: Self-pay

## 2023-09-20 ENCOUNTER — Telehealth: Payer: Self-pay

## 2023-09-20 NOTE — Telephone Encounter (Signed)
 Pharmacy Patient Advocate Encounter   Received notification from CoverMyMeds that prior authorization for ZEPBOUND  is required/requested.   Insurance verification completed.   The patient is insured through [facility] .   Per test claim: PA required; PA submitted to above mentioned insurance via CoverMyMeds Key/confirmation #/EOC [id number] Status is pending

## 2023-09-20 NOTE — Telephone Encounter (Signed)
 Pharmacy Patient Advocate Encounter  Received notification from [employer] that Prior Authorization for ZEPBOUND  has been APPROVED from 09/20/2023 to 03/19/2024   PA #/Case ID/Reference #: [id number]

## 2023-10-27 ENCOUNTER — Ambulatory Visit: Payer: 59 | Admitting: Family Medicine

## 2023-10-27 ENCOUNTER — Encounter: Payer: Self-pay | Admitting: Family Medicine

## 2023-10-27 ENCOUNTER — Telehealth: Payer: Self-pay | Admitting: Family Medicine

## 2023-10-27 VITALS — BP 128/85 | HR 68 | Temp 97.8°F | Resp 18 | Ht 72.0 in | Wt 304.8 lb

## 2023-10-27 DIAGNOSIS — Z6841 Body Mass Index (BMI) 40.0 and over, adult: Secondary | ICD-10-CM | POA: Diagnosis not present

## 2023-10-27 DIAGNOSIS — E66813 Obesity, class 3: Secondary | ICD-10-CM | POA: Diagnosis not present

## 2023-10-27 DIAGNOSIS — Z7689 Persons encountering health services in other specified circumstances: Secondary | ICD-10-CM

## 2023-10-27 MED ORDER — TIRZEPATIDE-WEIGHT MANAGEMENT 5 MG/0.5ML ~~LOC~~ SOLN: 5.0000 mg | SUBCUTANEOUS | 0 refills | Status: DC

## 2023-10-27 MED ORDER — ZEPBOUND 5 MG/0.5ML ~~LOC~~ SOAJ: 5.0000 mg | SUBCUTANEOUS | 0 refills | Status: DC

## 2023-10-27 NOTE — Telephone Encounter (Signed)
 Pt came back in and stated that he requested Zepbound, not Mounjaro for his prescription refill. Please advise.

## 2023-10-31 ENCOUNTER — Encounter: Payer: Self-pay | Admitting: Family Medicine

## 2023-10-31 NOTE — Progress Notes (Signed)
 Established Patient Office Visit  Subjective    Patient ID: Derek Fernandez, male    DOB: 28-Aug-1990  Age: 33 y.o. MRN: [id number]  CC:  Chief Complaint  Patient presents with   Medication Refill    Monthly weight check    HPI Derek Fernandez presents for routine weight management  Outpatient Encounter Medications as of 10/27/2023  Medication Sig   ondansetron (ZOFRAN-ODT) 4 MG disintegrating tablet Take 1 tablet (4 mg total) by mouth every 8 (eight) hours as needed for nausea or vomiting.   tirzepatide (ZEPBOUND) 2.5 MG/0.5ML injection vial Inject 2.5 mg into the skin once a week.   tirzepatide (ZEPBOUND) 5 MG/0.5ML Pen Inject 5 mg into the skin once a week.   [DISCONTINUED] tirzepatide 5 MG/0.5ML injection vial Inject 5 mg into the skin once a week.   chlorpheniramine (CHLOR-TRIMETON) 4 MG tablet 1 tablet as needed (Patient not taking: Reported on 04/11/2023)   ciprofloxacin (CIPRO) 500 MG tablet Take 1 tablet (500 mg total) by mouth 2 (two) times daily. (Patient not taking: Reported on 09/15/2023)   ibuprofen (ADVIL) 800 MG tablet Take 800 mg by mouth 3 (three) times daily. (Patient not taking: Reported on 01/06/2023)   STATUS COVID-19/FLU A&B KIT TEST AS DIRECTED TODAY (Patient not taking: Reported on 01/06/2023)   [DISCONTINUED] Semaglutide-Weight Management (WEGOVY) 1 MG/0.5ML SOAJ Inject 1 mg into the skin once a week. (Patient not taking: Reported on 09/15/2023)   No facility-administered encounter medications on file as of 10/27/2023.    Past Medical History:  Diagnosis Date   Personal history of asthma     Past Surgical History:  Procedure Laterality Date   TONSILLECTOMY      Family History  Problem Relation Age of Onset   Migraines Father        severe & give stroke like symptoms   Diabetes Paternal Grandmother    Heart disease Paternal Grandmother    Diabetes Paternal Grandfather    Heart disease Paternal Grandfather     Social History   Socioeconomic  History   Marital status: Married    Spouse name: Not on file   Number of children: Not on file   Years of education: Not on file   Highest education level: Some college, no degree  Occupational History   Not on file  Tobacco Use   Smoking status: Never   Smokeless tobacco: Former    Types: Snuff   Tobacco comments:    Patient quit a year ago  Substance and Sexual Activity   Alcohol use: Yes    Comment: Occassional   Drug use: Never   Sexual activity: Yes  Other Topics Concern   Not on file  Social History Narrative   Not on file   Social Drivers of Health   Financial Resource Strain: Low Risk  (09/11/2023)   Overall Financial Resource Strain (CARDIA)    Difficulty of Paying Living Expenses: Not very hard  Food Insecurity: Food Insecurity Present (09/11/2023)   Hunger Vital Sign    Worried About Running Out of Food in the Last Year: Sometimes true    Ran Out of Food in the Last Year: Never true  Transportation Needs: No Transportation Needs (09/11/2023)   PRAPARE - Administrator, Civil Service (Medical): No    Lack of Transportation (Non-Medical): No  Physical Activity: Insufficiently Active (09/11/2023)   Exercise Vital Sign    Days of Exercise per Week: 3 days    Minutes of  Exercise per Session: 40 min  Stress: No Stress Concern Present (09/11/2023)   [employer] of Occupational Health - Occupational Stress Questionnaire    Feeling of Stress : Not at all  Social Connections: Socially Integrated (09/11/2023)   Social Connection and Isolation Panel [NHANES]    Frequency of Communication with Friends and Family: Twice a week    Frequency of Social Gatherings with Friends and Family: Once a week    Attends Religious Services: More than 4 times per year    Active Member of [employer] or Organizations: Yes    Attends Banker Meetings: Never    Marital Status: Married  Catering manager Violence: Not on file    Review of Systems  All other systems  reviewed and are negative.       Objective    BP 128/85   Pulse 68   Temp 97.8 F (36.6 C) (Oral)   Resp 18   Ht 6' (1.829 m)   Wt (!) 304 lb 12.8 oz (138.3 kg)   SpO2 96%   BMI 41.34 kg/m   Physical Exam Vitals and nursing note reviewed.  Constitutional:      General: He is not in acute distress.    Appearance: He is obese.  Cardiovascular:     Rate and Rhythm: Normal rate and regular rhythm.  Pulmonary:     Effort: Pulmonary effort is normal.     Breath sounds: Normal breath sounds.  Abdominal:     Palpations: Abdomen is soft.     Tenderness: There is no abdominal tenderness.  Neurological:     General: No focal deficit present.     Mental Status: He is alert and oriented to person, place, and time.         Assessment & Plan:   Encounter for weight management  Class 3 severe obesity due to excess calories without serious comorbidity with body mass index (BMI) of 40.0 to 44.9 in adult [facility])  Other orders -     Zepbound; Inject 5 mg into the skin once a week.  Dispense: 2 mL; Refill: 0     Return in about 4 weeks (around 11/24/2023) for follow up, weight management.   Tommie Raymond, MD

## 2023-11-06 ENCOUNTER — Other Ambulatory Visit: Payer: Self-pay

## 2023-11-24 ENCOUNTER — Ambulatory Visit: Admitting: Family Medicine

## 2023-11-24 ENCOUNTER — Encounter: Payer: Self-pay | Admitting: Family Medicine

## 2023-11-24 VITALS — BP 123/86 | HR 85 | Wt 300.0 lb

## 2023-11-24 DIAGNOSIS — Z6841 Body Mass Index (BMI) 40.0 and over, adult: Secondary | ICD-10-CM | POA: Diagnosis not present

## 2023-11-24 DIAGNOSIS — E66813 Obesity, class 3: Secondary | ICD-10-CM

## 2023-11-24 DIAGNOSIS — Z7689 Persons encountering health services in other specified circumstances: Secondary | ICD-10-CM | POA: Diagnosis not present

## 2023-11-24 MED ORDER — TIRZEPATIDE-WEIGHT MANAGEMENT 7.5 MG/0.5ML ~~LOC~~ SOLN
7.5000 mg | SUBCUTANEOUS | 0 refills | Status: AC
Start: 2023-11-24 — End: ?

## 2023-11-29 ENCOUNTER — Encounter: Payer: Self-pay | Admitting: Family Medicine

## 2023-11-29 NOTE — Progress Notes (Signed)
 Established Patient Office Visit  Subjective    Patient ID: Derek Fernandez, male    DOB: [birth year]  Age: 33 y.o. MRN: [id number]  CC:  Chief Complaint  Patient presents with   Weight Management Screening    HPI Derek Fernandez presents for routine weight management. He reports doing well with present management.   Outpatient Encounter Medications as of 11/24/2023  Medication Sig   ondansetron (ZOFRAN-ODT) 4 MG disintegrating tablet Take 1 tablet (4 mg total) by mouth every 8 (eight) hours as needed for nausea or vomiting.   tirzepatide (ZEPBOUND) 2.5 MG/0.5ML injection vial Inject 2.5 mg into the skin once a week.   tirzepatide (ZEPBOUND) 5 MG/0.5ML Pen Inject 5 mg into the skin once a week.   tirzepatide 7.5 MG/0.5ML injection vial Inject 7.5 mg into the skin once a week.   chlorpheniramine (CHLOR-TRIMETON) 4 MG tablet 1 tablet as needed (Patient not taking: Reported on 04/11/2023)   ciprofloxacin (CIPRO) 500 MG tablet Take 1 tablet (500 mg total) by mouth 2 (two) times daily. (Patient not taking: Reported on 09/15/2023)   ibuprofen (ADVIL) 800 MG tablet Take 800 mg by mouth 3 (three) times daily. (Patient not taking: Reported on 01/06/2023)   STATUS COVID-19/FLU A&B KIT TEST AS DIRECTED TODAY (Patient not taking: Reported on 01/06/2023)   No facility-administered encounter medications on file as of 11/24/2023.    Past Medical History:  Diagnosis Date   Personal history of asthma     Past Surgical History:  Procedure Laterality Date   TONSILLECTOMY      Family History  Problem Relation Age of Onset   Migraines Father        severe & give stroke like symptoms   Diabetes Paternal Grandmother    Heart disease Paternal Grandmother    Diabetes Paternal Grandfather    Heart disease Paternal Grandfather     Social History   Socioeconomic History   Marital status: Married    Spouse name: Not on file   Number of children: Not on file   Years of education: Not on file    Highest education level: Some college, no degree  Occupational History   Not on file  Tobacco Use   Smoking status: Never   Smokeless tobacco: Former    Types: Snuff   Tobacco comments:    Patient quit a year ago  Substance and Sexual Activity   Alcohol use: Yes    Comment: Occassional   Drug use: Never   Sexual activity: Yes  Other Topics Concern   Not on file  Social History Narrative   Not on file   Social Drivers of Health   Financial Resource Strain: Low Risk  (09/11/2023)   Overall Financial Resource Strain (CARDIA)    Difficulty of Paying Living Expenses: Not very hard  Food Insecurity: No Food Insecurity (11/24/2023)   Hunger Vital Sign    Worried About Running Out of Food in the Last Year: Never true    Ran Out of Food in the Last Year: Never true  Recent Concern: Food Insecurity - Food Insecurity Present (09/11/2023)   Hunger Vital Sign    Worried About Running Out of Food in the Last Year: Sometimes true    Ran Out of Food in the Last Year: Never true  Transportation Needs: No Transportation Needs (11/24/2023)   PRAPARE - Administrator, Civil Service (Medical): No    Lack of Transportation (Non-Medical): No  Physical Activity: Insufficiently Active (  09/11/2023)   Exercise Vital Sign    Days of Exercise per Week: 3 days    Minutes of Exercise per Session: 40 min  Stress: No Stress Concern Present (09/11/2023)   [employer] of Occupational Health - Occupational Stress Questionnaire    Feeling of Stress : Not at all  Social Connections: Socially Integrated (09/11/2023)   Social Connection and Isolation Panel [NHANES]    Frequency of Communication with Friends and Family: Twice a week    Frequency of Social Gatherings with Friends and Family: Once a week    Attends Religious Services: More than 4 times per year    Active Member of [employer] or Organizations: Yes    Attends Banker Meetings: Never    Marital Status: Married  Careers information officer Violence: Not on file    Review of Systems  All other systems reviewed and are negative.       Objective    BP 123/86 (BP Location: Right Arm, Patient Position: Sitting, Cuff Size: Large)   Pulse 85   Wt 300 lb (136.1 kg)   SpO2 92%   BMI 40.69 kg/m   Physical Exam Vitals and nursing note reviewed.  Constitutional:      General: He is not in acute distress.    Appearance: He is obese.  Cardiovascular:     Rate and Rhythm: Normal rate and regular rhythm.  Pulmonary:     Effort: Pulmonary effort is normal.     Breath sounds: Normal breath sounds.  Abdominal:     Palpations: Abdomen is soft.     Tenderness: There is no abdominal tenderness.  Neurological:     General: No focal deficit present.     Mental Status: He is alert and oriented to person, place, and time.         Assessment & Plan:   Encounter for weight management  Class 3 severe obesity due to excess calories without serious comorbidity with body mass index (BMI) of 40.0 to 44.9 in adult [facility])  Other orders -     Tirzepatide-Weight Management; Inject 7.5 mg into the skin once a week.  Dispense: 2 mL; Refill: 0     Return in about 4 weeks (around 12/22/2023) for follow up.   Derek Lama, MD

## 2023-12-22 ENCOUNTER — Encounter: Payer: Self-pay | Admitting: Family Medicine

## 2023-12-22 ENCOUNTER — Ambulatory Visit: Admitting: Family Medicine

## 2023-12-22 VITALS — BP 131/84 | HR 83 | Wt 300.2 lb

## 2023-12-22 DIAGNOSIS — E66813 Obesity, class 3: Secondary | ICD-10-CM

## 2023-12-22 DIAGNOSIS — Z7689 Persons encountering health services in other specified circumstances: Secondary | ICD-10-CM

## 2023-12-22 DIAGNOSIS — Z6841 Body Mass Index (BMI) 40.0 and over, adult: Secondary | ICD-10-CM

## 2023-12-22 MED ORDER — TIRZEPATIDE-WEIGHT MANAGEMENT 7.5 MG/0.5ML ~~LOC~~ SOLN: 7.5000 mg | SUBCUTANEOUS | 0 refills | Status: AC

## 2023-12-26 ENCOUNTER — Encounter: Payer: Self-pay | Admitting: Family Medicine

## 2023-12-26 NOTE — Progress Notes (Signed)
 Established Patient Office Visit  Subjective    Patient ID: Derek Fernandez, male    DOB: 07/12/1991  Age: 33 y.o. MRN: [id number]  CC:  Chief Complaint  Patient presents with   Medical Management of Chronic Issues    HPI Derek Fernandez presents for routine weight management. Patient reports med compliance and denies acute complaints.   Outpatient Encounter Medications as of 12/22/2023  Medication Sig   tirzepatide  (ZEPBOUND ) 2.5 MG/0.5ML injection vial Inject 2.5 mg into the skin once a week.   tirzepatide  (ZEPBOUND ) 5 MG/0.5ML Pen Inject 5 mg into the skin once a week.   tirzepatide  7.5 MG/0.5ML injection vial Inject 7.5 mg into the skin once a week.   tirzepatide  7.5 MG/0.5ML injection vial Inject 7.5 mg into the skin once a week.   chlorpheniramine (CHLOR-TRIMETON) 4 MG tablet 1 tablet as needed (Patient not taking: Reported on 12/22/2023)   ciprofloxacin  (CIPRO ) 500 MG tablet Take 1 tablet (500 mg total) by mouth 2 (two) times daily. (Patient not taking: Reported on 12/22/2023)   ibuprofen (ADVIL) 800 MG tablet Take 800 mg by mouth 3 (three) times daily. (Patient not taking: Reported on 12/22/2023)   ondansetron  (ZOFRAN -ODT) 4 MG disintegrating tablet Take 1 tablet (4 mg total) by mouth every 8 (eight) hours as needed for nausea or vomiting. (Patient not taking: Reported on 12/22/2023)   STATUS COVID-19/FLU A&B KIT TEST AS DIRECTED TODAY (Patient not taking: Reported on 12/22/2023)   No facility-administered encounter medications on file as of 12/22/2023.    Past Medical History:  Diagnosis Date   Personal history of asthma     Past Surgical History:  Procedure Laterality Date   TONSILLECTOMY      Family History  Problem Relation Age of Onset   Migraines Father        severe & give stroke like symptoms   Diabetes Paternal Grandmother    Heart disease Paternal Grandmother    Diabetes Paternal Grandfather    Heart disease Paternal Grandfather     Social History    Socioeconomic History   Marital status: Married    Spouse name: Not on file   Number of children: Not on file   Years of education: Not on file   Highest education level: Some college, no degree  Occupational History   Not on file  Tobacco Use   Smoking status: Never   Smokeless tobacco: Former    Types: Snuff   Tobacco comments:    Patient quit a year ago  Substance and Sexual Activity   Alcohol use: Yes    Comment: Occassional   Drug use: Never   Sexual activity: Yes  Other Topics Concern   Not on file  Social History Narrative   Not on file   Social Drivers of Health   Financial Resource Strain: Low Risk  (09/11/2023)   Overall Financial Resource Strain (CARDIA)    Difficulty of Paying Living Expenses: Not very hard  Food Insecurity: No Food Insecurity (11/24/2023)   Hunger Vital Sign    Worried About Running Out of Food in the Last Year: Never true    Ran Out of Food in the Last Year: Never true  Recent Concern: Food Insecurity - Food Insecurity Present (09/11/2023)   Hunger Vital Sign    Worried About Running Out of Food in the Last Year: Sometimes true    Ran Out of Food in the Last Year: Never true  Transportation Needs: No Transportation Needs (11/24/2023)  PRAPARE - Administrator, Civil Service (Medical): No    Lack of Transportation (Non-Medical): No  Physical Activity: Insufficiently Active (09/11/2023)   Exercise Vital Sign    Days of Exercise per Week: 3 days    Minutes of Exercise per Session: 40 min  Stress: No Stress Concern Present (09/11/2023)   [employer] of Occupational Health - Occupational Stress Questionnaire    Feeling of Stress : Not at all  Social Connections: Socially Integrated (09/11/2023)   Social Connection and Isolation Panel [NHANES]    Frequency of Communication with Friends and Family: Twice a week    Frequency of Social Gatherings with Friends and Family: Once a week    Attends Religious Services: More than 4  times per year    Active Member of [employer] or Organizations: Yes    Attends Banker Meetings: Never    Marital Status: Married  Catering manager Violence: Not on file    Review of Systems  All other systems reviewed and are negative.       Objective    BP 131/84 (BP Location: Right Arm, Patient Position: Sitting, Cuff Size: Large)   Pulse 83   Wt (!) 300 lb 3.2 oz (136.2 kg)   SpO2 95%   BMI 40.71 kg/m   Physical Exam Vitals and nursing note reviewed.  Constitutional:      General: He is not in acute distress.    Appearance: He is obese.  Cardiovascular:     Rate and Rhythm: Normal rate and regular rhythm.  Pulmonary:     Effort: Pulmonary effort is normal.     Breath sounds: Normal breath sounds.  Abdominal:     Palpations: Abdomen is soft.     Tenderness: There is no abdominal tenderness.  Neurological:     General: No focal deficit present.     Mental Status: He is alert and oriented to person, place, and time.         Assessment & Plan:   Encounter for weight management  Class 3 severe obesity due to excess calories without serious comorbidity with body mass index (BMI) of 40.0 to 44.9 in adult  Other orders -     Tirzepatide -Weight Management; Inject 7.5 mg into the skin once a week.  Dispense: 2 mL; Refill: 0     Return in about 4 weeks (around 01/19/2024) for follow up, weight management.   Arlo Lama, MD

## 2024-01-26 ENCOUNTER — Ambulatory Visit: Admitting: Family Medicine

## 2024-01-26 VITALS — BP 132/85 | HR 78 | Temp 98.1°F | Resp 16 | Ht 72.0 in | Wt 297.0 lb

## 2024-01-26 DIAGNOSIS — E66813 Obesity, class 3: Secondary | ICD-10-CM

## 2024-01-26 DIAGNOSIS — Z7689 Persons encountering health services in other specified circumstances: Secondary | ICD-10-CM

## 2024-01-26 DIAGNOSIS — Z6841 Body Mass Index (BMI) 40.0 and over, adult: Secondary | ICD-10-CM | POA: Diagnosis not present

## 2024-01-26 DIAGNOSIS — E669 Obesity, unspecified: Secondary | ICD-10-CM

## 2024-01-26 MED ORDER — TIRZEPATIDE-WEIGHT MANAGEMENT 10 MG/0.5ML ~~LOC~~ SOAJ: 10.0000 mg | SUBCUTANEOUS | 0 refills | Status: DC

## 2024-01-26 NOTE — Progress Notes (Unsigned)
 Established Patient Office Visit  Subjective    Patient ID: Derek Fernandez, male    DOB: 11-26-1990  Age: 33 y.o. MRN: [id number]  CC:  Chief Complaint  Patient presents with   Weight Check    HPI Derek Fernandez presents for routine weight management. Patient reports doing well with present management and denies acute complaints.   Outpatient Encounter Medications as of 01/26/2024  Medication Sig   tirzepatide  (ZEPBOUND ) 10 MG/0.5ML Pen Inject 10 mg into the skin once a week.   tirzepatide  (ZEPBOUND ) 2.5 MG/0.5ML injection vial Inject 2.5 mg into the skin once a week.   tirzepatide  (ZEPBOUND ) 5 MG/0.5ML Pen Inject 5 mg into the skin once a week.   tirzepatide  7.5 MG/0.5ML injection vial Inject 7.5 mg into the skin once a week.   tirzepatide  7.5 MG/0.5ML injection vial Inject 7.5 mg into the skin once a week.   chlorpheniramine (CHLOR-TRIMETON) 4 MG tablet 1 tablet as needed (Patient not taking: Reported on 01/26/2024)   ciprofloxacin  (CIPRO ) 500 MG tablet Take 1 tablet (500 mg total) by mouth 2 (two) times daily. (Patient not taking: Reported on 01/26/2024)   ibuprofen (ADVIL) 800 MG tablet Take 800 mg by mouth 3 (three) times daily. (Patient not taking: Reported on 01/26/2024)   ondansetron  (ZOFRAN -ODT) 4 MG disintegrating tablet Take 1 tablet (4 mg total) by mouth every 8 (eight) hours as needed for nausea or vomiting. (Patient not taking: Reported on 01/26/2024)   STATUS COVID-19/FLU A&B KIT TEST AS DIRECTED TODAY (Patient not taking: Reported on 01/26/2024)   No facility-administered encounter medications on file as of 01/26/2024.    Past Medical History:  Diagnosis Date   Personal history of asthma     Past Surgical History:  Procedure Laterality Date   TONSILLECTOMY      Family History  Problem Relation Age of Onset   Migraines Father        severe & give stroke like symptoms   Diabetes Paternal Grandmother    Heart disease Paternal Grandmother    Diabetes  Paternal Grandfather    Heart disease Paternal Grandfather     Social History   Socioeconomic History   Marital status: Married    Spouse name: Not on file   Number of children: Not on file   Years of education: Not on file   Highest education level: Some college, no degree  Occupational History   Not on file  Tobacco Use   Smoking status: Never   Smokeless tobacco: Former    Types: Snuff   Tobacco comments:    Patient quit a year ago  Substance and Sexual Activity   Alcohol use: Yes    Comment: Occassional   Drug use: Never   Sexual activity: Yes  Other Topics Concern   Not on file  Social History Narrative   Not on file   Social Drivers of Health   Financial Resource Strain: Low Risk  (01/25/2024)   Overall Financial Resource Strain (CARDIA)    Difficulty of Paying Living Expenses: Not hard at all  Food Insecurity: Food Insecurity Present (01/25/2024)   Hunger Vital Sign    Worried About Running Out of Food in the Last Year: Never true    Ran Out of Food in the Last Year: Sometimes true  Transportation Needs: No Transportation Needs (01/25/2024)   PRAPARE - Administrator, Civil Service (Medical): No    Lack of Transportation (Non-Medical): No  Physical Activity: Sufficiently Active (01/25/2024)  Exercise Vital Sign    Days of Exercise per Week: 4 days    Minutes of Exercise per Session: 60 min  Stress: No Stress Concern Present (01/25/2024)   [employer] of Occupational Health - Occupational Stress Questionnaire    Feeling of Stress: Not at all  Social Connections: Socially Integrated (01/25/2024)   Social Connection and Isolation Panel    Frequency of Communication with Friends and Family: More than three times a week    Frequency of Social Gatherings with Friends and Family: Once a week    Attends Religious Services: More than 4 times per year    Active Member of [employer] or Organizations: Yes    Attends Banker Meetings: Never     Marital Status: Married  Catering manager Violence: Not on file    Review of Systems  All other systems reviewed and are negative.       Objective    BP 132/85   Pulse 78   Temp 98.1 F (36.7 C) (Oral)   Resp 16   Ht 6' (1.829 m)   Wt 297 lb (134.7 kg)   SpO2 97%   BMI 40.28 kg/m   Physical Exam Vitals and nursing note reviewed.  Constitutional:      General: He is not in acute distress.    Appearance: He is obese.   Cardiovascular:     Rate and Rhythm: Normal rate and regular rhythm.  Pulmonary:     Effort: Pulmonary effort is normal.     Breath sounds: Normal breath sounds.  Abdominal:     Palpations: Abdomen is soft.     Tenderness: There is no abdominal tenderness.   Neurological:     General: No focal deficit present.     Mental Status: He is alert and oriented to person, place, and time.         Assessment & Plan:   Encounter for weight management  Class 3 severe obesity due to excess calories without serious comorbidity with body mass index (BMI) of 40.0 to 44.9 in adult  Other orders -     Tirzepatide -Weight Management; Inject 10 mg into the skin once a week.  Dispense: 2 mL; Refill: 0     Return in about 4 weeks (around 02/23/2024) for follow up, weight management.   Arlo Lama, MD

## 2024-01-29 ENCOUNTER — Encounter: Payer: Self-pay | Admitting: Family Medicine

## 2024-02-29 ENCOUNTER — Ambulatory Visit (INDEPENDENT_AMBULATORY_CARE_PROVIDER_SITE_OTHER): Admitting: Family Medicine

## 2024-02-29 ENCOUNTER — Encounter: Payer: Self-pay | Admitting: Family Medicine

## 2024-02-29 VITALS — BP 128/85 | HR 74 | Ht 72.0 in | Wt 290.0 lb

## 2024-02-29 DIAGNOSIS — Z0000 Encounter for general adult medical examination without abnormal findings: Secondary | ICD-10-CM | POA: Diagnosis not present

## 2024-02-29 DIAGNOSIS — Z1329 Encounter for screening for other suspected endocrine disorder: Secondary | ICD-10-CM | POA: Diagnosis not present

## 2024-02-29 DIAGNOSIS — Z13228 Encounter for screening for other metabolic disorders: Secondary | ICD-10-CM

## 2024-02-29 DIAGNOSIS — Z130 Encounter for screening for diseases of the blood and blood-forming organs and certain disorders involving the immune mechanism: Secondary | ICD-10-CM | POA: Diagnosis not present

## 2024-02-29 DIAGNOSIS — Z13220 Encounter for screening for lipoid disorders: Secondary | ICD-10-CM | POA: Diagnosis not present

## 2024-02-29 MED ORDER — TIRZEPATIDE-WEIGHT MANAGEMENT 10 MG/0.5ML ~~LOC~~ SOAJ: 10.0000 mg | SUBCUTANEOUS | 0 refills | Status: AC

## 2024-02-29 NOTE — Progress Notes (Signed)
 Established Patient Office Visit  Subjective    Patient ID: Derek Fernandez, male    DOB: 03-Aug-1991  Age: 33 y.o. MRN: [id number]  CC:  Chief Complaint  Patient presents with   Annual Exam    Hep B vaccine needed    HPI Derek Fernandez presents for routine annual exam. Patient reports that he has been doing well with his weight loss journey.   Outpatient Encounter Medications as of 02/29/2024  Medication Sig   [DISCONTINUED] tirzepatide  (ZEPBOUND ) 10 MG/0.5ML Pen Inject 10 mg into the skin once a week.   chlorpheniramine (CHLOR-TRIMETON) 4 MG tablet 1 tablet as needed (Patient not taking: Reported on 01/26/2024)   ciprofloxacin  (CIPRO ) 500 MG tablet Take 1 tablet (500 mg total) by mouth 2 (two) times daily. (Patient not taking: Reported on 01/26/2024)   ibuprofen (ADVIL) 800 MG tablet Take 800 mg by mouth 3 (three) times daily. (Patient not taking: Reported on 01/26/2024)   ondansetron  (ZOFRAN -ODT) 4 MG disintegrating tablet Take 1 tablet (4 mg total) by mouth every 8 (eight) hours as needed for nausea or vomiting. (Patient not taking: Reported on 01/26/2024)   STATUS COVID-19/FLU A&B KIT TEST AS DIRECTED TODAY (Patient not taking: Reported on 01/26/2024)   tirzepatide  (ZEPBOUND ) 10 MG/0.5ML Pen Inject 10 mg into the skin once a week.   tirzepatide  7.5 MG/0.5ML injection vial Inject 7.5 mg into the skin once a week. (Patient not taking: Reported on 02/29/2024)   tirzepatide  7.5 MG/0.5ML injection vial Inject 7.5 mg into the skin once a week. (Patient not taking: Reported on 02/29/2024)   [DISCONTINUED] tirzepatide  (ZEPBOUND ) 2.5 MG/0.5ML injection vial Inject 2.5 mg into the skin once a week. (Patient not taking: Reported on 02/29/2024)   [DISCONTINUED] tirzepatide  (ZEPBOUND ) 5 MG/0.5ML Pen Inject 5 mg into the skin once a week. (Patient not taking: Reported on 02/29/2024)   No facility-administered encounter medications on file as of 02/29/2024.    Past Medical History:  Diagnosis Date    Personal history of asthma     Past Surgical History:  Procedure Laterality Date   TONSILLECTOMY      Family History  Problem Relation Age of Onset   Migraines Father        severe & give stroke like symptoms   Diabetes Paternal Grandmother    Heart disease Paternal Grandmother    Diabetes Paternal Grandfather    Heart disease Paternal Grandfather     Social History   Socioeconomic History   Marital status: Married    Spouse name: Not on file   Number of children: Not on file   Years of education: Not on file   Highest education level: Some college, no degree  Occupational History   Not on file  Tobacco Use   Smoking status: Never   Smokeless tobacco: Former    Types: Snuff   Tobacco comments:    Patient quit a year ago  Substance and Sexual Activity   Alcohol use: Yes    Comment: Occassional   Drug use: Never   Sexual activity: Yes  Other Topics Concern   Not on file  Social History Narrative   Not on file   Social Drivers of Health   Financial Resource Strain: Low Risk  (01/25/2024)   Overall Financial Resource Strain (CARDIA)    Difficulty of Paying Living Expenses: Not hard at all  Food Insecurity: Food Insecurity Present (01/25/2024)   Hunger Vital Sign    Worried About Programme researcher, broadcasting/film/video in  the Last Year: Never true    Ran Out of Food in the Last Year: Sometimes true  Transportation Needs: No Transportation Needs (01/25/2024)   PRAPARE - Administrator, Civil Service (Medical): No    Lack of Transportation (Non-Medical): No  Physical Activity: Sufficiently Active (01/25/2024)   Exercise Vital Sign    Days of Exercise per Week: 4 days    Minutes of Exercise per Session: 60 min  Stress: No Stress Concern Present (01/25/2024)   [employer] of Occupational Health - Occupational Stress Questionnaire    Feeling of Stress: Not at all  Social Connections: Socially Integrated (01/25/2024)   Social Connection and Isolation Panel     Frequency of Communication with Friends and Family: More than three times a week    Frequency of Social Gatherings with Friends and Family: Once a week    Attends Religious Services: More than 4 times per year    Active Member of [employer] or Organizations: Yes    Attends Banker Meetings: Never    Marital Status: Married  Catering manager Violence: Not on file    Review of Systems  All other systems reviewed and are negative.       Objective    BP 128/85   Pulse 74   Ht 6' (1.829 m)   Wt 290 lb (131.5 kg)   SpO2 96%   BMI 39.33 kg/m   Physical Exam Vitals and nursing note reviewed.  Constitutional:      General: He is not in acute distress.    Appearance: He is obese.  Cardiovascular:     Rate and Rhythm: Normal rate and regular rhythm.  Pulmonary:     Effort: Pulmonary effort is normal.     Breath sounds: Normal breath sounds.  Abdominal:     Palpations: Abdomen is soft.     Tenderness: There is no abdominal tenderness.  Neurological:     General: No focal deficit present.     Mental Status: He is alert and oriented to person, place, and time.         Assessment & Plan:   Annual physical exam -     CMP14+EGFR  Screening for deficiency anemia -     CBC with Differential/Platelet  Screening for lipid disorders -     Lipid panel  Screening for endocrine/metabolic/immunity disorders -     Hemoglobin A1c  Other orders -     Tirzepatide -Weight Management; Inject 10 mg into the skin once a week.  Dispense: 2 mL; Refill: 0     Return in about 4 weeks (around 03/28/2024) for follow up.   Tanda Raguel SQUIBB, MD

## 2024-03-01 LAB — CBC WITH DIFFERENTIAL/PLATELET
Basophils Absolute: 0.1 x10E3/uL (ref 0.0–0.2)
Basos: 1 %
EOS (ABSOLUTE): 0.1 x10E3/uL (ref 0.0–0.4)
Eos: 1 %
Hematocrit: 48.8 % (ref 37.5–51.0)
Hemoglobin: 16.4 g/dL (ref 13.0–17.7)
Immature Grans (Abs): 0 x10E3/uL (ref 0.0–0.1)
Immature Granulocytes: 0 %
Lymphocytes Absolute: 2.2 x10E3/uL (ref 0.7–3.1)
Lymphs: 28 %
MCH: 31.2 pg (ref 26.6–33.0)
MCHC: 33.6 g/dL (ref 31.5–35.7)
MCV: 93 fL (ref 79–97)
Monocytes Absolute: 0.8 x10E3/uL (ref 0.1–0.9)
Monocytes: 10 %
Neutrophils Absolute: 4.7 x10E3/uL (ref 1.4–7.0)
Neutrophils: 60 %
Platelets: 281 x10E3/uL (ref 150–450)
RBC: 5.25 x10E6/uL (ref 4.14–5.80)
RDW: 13 % (ref 11.6–15.4)
WBC: 7.8 x10E3/uL (ref 3.4–10.8)

## 2024-03-01 LAB — LIPID PANEL
Chol/HDL Ratio: 4.4 ratio (ref 0.0–5.0)
Cholesterol, Total: 172 mg/dL (ref 100–199)
HDL: 39 mg/dL — ABNORMAL LOW (ref >39)
LDL Chol Calc (NIH): 92 mg/dL (ref 0–99)
Triglycerides: 246 mg/dL — ABNORMAL HIGH (ref 0–149)
VLDL Cholesterol Cal: 41 mg/dL — ABNORMAL HIGH (ref 5–40)

## 2024-03-01 LAB — CMP14+EGFR
ALT: 34 IU/L (ref 0–44)
AST: 20 IU/L (ref 0–40)
Albumin: 4.4 g/dL (ref 4.3–5.2)
Alkaline Phosphatase: 54 IU/L (ref 44–121)
BUN/Creatinine Ratio: 10 (ref 9–20)
BUN: 10 mg/dL (ref 6–20)
Bilirubin Total: 0.4 mg/dL (ref 0.0–1.2)
CO2: 19 mmol/L — ABNORMAL LOW (ref 20–29)
Calcium: 9.3 mg/dL (ref 8.7–10.2)
Chloride: 106 mmol/L (ref 96–106)
Creatinine, Ser: 0.99 mg/dL (ref 0.76–1.27)
Globulin, Total: 2.6 g/dL (ref 1.5–4.5)
Glucose: 87 mg/dL (ref 70–99)
Potassium: 4.4 mmol/L (ref 3.5–5.2)
Sodium: 141 mmol/L (ref 134–144)
Total Protein: 7 g/dL (ref 6.0–8.5)
eGFR: 105 mL/min/1.73 (ref >59)

## 2024-03-01 LAB — HEMOGLOBIN A1C
Est. average glucose Bld gHb Est-mCnc: 105 mg/dL
Hgb A1c MFr Bld: 5.3 % (ref 4.8–5.6)

## 2024-03-11 ENCOUNTER — Ambulatory Visit: Payer: Self-pay | Admitting: Family Medicine

## 2024-03-11 MED ORDER — ATORVASTATIN CALCIUM 10 MG PO TABS: 10.0000 mg | ORAL_TABLET | Freq: Every day | ORAL | 0 refills | Status: DC

## 2024-03-29 ENCOUNTER — Encounter: Payer: Self-pay | Admitting: Family Medicine

## 2024-03-29 ENCOUNTER — Ambulatory Visit: Admitting: Family Medicine

## 2024-03-29 VITALS — BP 132/81 | HR 74 | Ht 72.0 in | Wt 293.0 lb

## 2024-03-29 DIAGNOSIS — Z6841 Body Mass Index (BMI) 40.0 and over, adult: Secondary | ICD-10-CM | POA: Diagnosis not present

## 2024-03-29 DIAGNOSIS — E785 Hyperlipidemia, unspecified: Secondary | ICD-10-CM

## 2024-03-29 DIAGNOSIS — E66813 Obesity, class 3: Secondary | ICD-10-CM | POA: Diagnosis not present

## 2024-03-29 DIAGNOSIS — Z7689 Persons encountering health services in other specified circumstances: Secondary | ICD-10-CM

## 2024-03-29 MED ORDER — ZEPBOUND 12.5 MG/0.5ML ~~LOC~~ SOAJ: 12.5000 mg | SUBCUTANEOUS | 0 refills | Status: AC

## 2024-03-29 NOTE — Progress Notes (Unsigned)
 Established Patient Office Visit  Subjective    Patient ID: Derek Fernandez, male    DOB: Feb 12, 1991  Age: 33 y.o. MRN: [id number]  CC:  Chief Complaint  Patient presents with   Medical Management of Chronic Issues    HPI Derek Fernandez presents for routine weight management. Patient reports that he did not do well with his diet and exercise and has gained weight.   Outpatient Encounter Medications as of 03/29/2024  Medication Sig   atorvastatin  (LIPITOR) 10 MG tablet Take 1 tablet (10 mg total) by mouth daily.   tirzepatide  (ZEPBOUND ) 10 MG/0.5ML Pen Inject 10 mg into the skin once a week.   tirzepatide  (ZEPBOUND ) 12.5 MG/0.5ML Pen Inject 12.5 mg into the skin once a week.   chlorpheniramine (CHLOR-TRIMETON) 4 MG tablet 1 tablet as needed (Patient not taking: Reported on 01/26/2024)   ciprofloxacin  (CIPRO ) 500 MG tablet Take 1 tablet (500 mg total) by mouth 2 (two) times daily. (Patient not taking: Reported on 01/26/2024)   ibuprofen (ADVIL) 800 MG tablet Take 800 mg by mouth 3 (three) times daily. (Patient not taking: Reported on 01/26/2024)   ondansetron  (ZOFRAN -ODT) 4 MG disintegrating tablet Take 1 tablet (4 mg total) by mouth every 8 (eight) hours as needed for nausea or vomiting. (Patient not taking: Reported on 01/26/2024)   STATUS COVID-19/FLU A&B KIT TEST AS DIRECTED TODAY (Patient not taking: Reported on 01/26/2024)   tirzepatide  7.5 MG/0.5ML injection vial Inject 7.5 mg into the skin once a week. (Patient not taking: Reported on 02/29/2024)   tirzepatide  7.5 MG/0.5ML injection vial Inject 7.5 mg into the skin once a week. (Patient not taking: Reported on 02/29/2024)   No facility-administered encounter medications on file as of 03/29/2024.    Past Medical History:  Diagnosis Date   Personal history of asthma     Past Surgical History:  Procedure Laterality Date   TONSILLECTOMY      Family History  Problem Relation Age of Onset   Migraines Father        severe &  give stroke like symptoms   Diabetes Paternal Grandmother    Heart disease Paternal Grandmother    Diabetes Paternal Grandfather    Heart disease Paternal Grandfather     Social History   Socioeconomic History   Marital status: Married    Spouse name: Not on file   Number of children: Not on file   Years of education: Not on file   Highest education level: Some college, no degree  Occupational History   Not on file  Tobacco Use   Smoking status: Never   Smokeless tobacco: Former    Types: Snuff   Tobacco comments:    Patient quit a year ago  Substance and Sexual Activity   Alcohol use: Yes    Comment: Occassional   Drug use: Never   Sexual activity: Yes  Other Topics Concern   Not on file  Social History Narrative   Not on file   Social Drivers of Health   Financial Resource Strain: Low Risk  (01/25/2024)   Overall Financial Resource Strain (CARDIA)    Difficulty of Paying Living Expenses: Not hard at all  Food Insecurity: Food Insecurity Present (01/25/2024)   Hunger Vital Sign    Worried About Running Out of Food in the Last Year: Never true    Ran Out of Food in the Last Year: Sometimes true  Transportation Needs: No Transportation Needs (01/25/2024)   PRAPARE - Transportation  Lack of Transportation (Medical): No    Lack of Transportation (Non-Medical): No  Physical Activity: Sufficiently Active (01/25/2024)   Exercise Vital Sign    Days of Exercise per Week: 4 days    Minutes of Exercise per Session: 60 min  Stress: No Stress Concern Present (01/25/2024)   [employer] of Occupational Health - Occupational Stress Questionnaire    Feeling of Stress: Not at all  Social Connections: Socially Integrated (01/25/2024)   Social Connection and Isolation Panel    Frequency of Communication with Friends and Family: More than three times a week    Frequency of Social Gatherings with Friends and Family: Once a week    Attends Religious Services: More than 4 times  per year    Active Member of [employer] or Organizations: Yes    Attends Banker Meetings: Never    Marital Status: Married  Catering manager Violence: Not on file    Review of Systems  All other systems reviewed and are negative.       Objective    BP 132/81   Pulse 74   Ht 6' (1.829 m)   Wt 293 lb (132.9 kg)   SpO2 97%   BMI 39.74 kg/m   Physical Exam Vitals and nursing note reviewed.  Constitutional:      General: He is not in acute distress.    Appearance: He is obese.  Cardiovascular:     Rate and Rhythm: Normal rate and regular rhythm.  Pulmonary:     Effort: Pulmonary effort is normal.     Breath sounds: Normal breath sounds.  Abdominal:     Palpations: Abdomen is soft.     Tenderness: There is no abdominal tenderness.  Neurological:     General: No focal deficit present.     Mental Status: He is alert and oriented to person, place, and time.         Assessment & Plan:   1. Encounter for weight management (Primary) Discussed compliance.   2. Class 3 severe obesity due to excess calories without serious comorbidity with body mass index (BMI) of 40.0 to 44.9 in adult   3. Hyperlipidemia, unspecified hyperlipidemia type Continue  Return in about 4 weeks (around 04/26/2024) for follow up, weight management.   Tanda Raguel SQUIBB, MD

## 2024-04-01 ENCOUNTER — Encounter: Payer: Self-pay | Admitting: Family Medicine

## 2024-04-01 ENCOUNTER — Other Ambulatory Visit: Payer: Self-pay

## 2024-04-08 ENCOUNTER — Other Ambulatory Visit: Payer: Self-pay

## 2024-04-29 ENCOUNTER — Encounter: Payer: Self-pay | Admitting: Family Medicine

## 2024-04-29 ENCOUNTER — Ambulatory Visit: Payer: Self-pay | Admitting: Family Medicine

## 2024-04-29 VITALS — BP 131/82 | HR 78 | Ht 72.0 in | Wt 300.4 lb

## 2024-04-29 DIAGNOSIS — L2989 Other pruritus: Secondary | ICD-10-CM | POA: Diagnosis not present

## 2024-04-29 DIAGNOSIS — Z6841 Body Mass Index (BMI) 40.0 and over, adult: Secondary | ICD-10-CM | POA: Diagnosis not present

## 2024-04-29 DIAGNOSIS — Z7689 Persons encountering health services in other specified circumstances: Secondary | ICD-10-CM

## 2024-04-29 DIAGNOSIS — E66813 Obesity, class 3: Secondary | ICD-10-CM

## 2024-04-29 DIAGNOSIS — W57XXXA Bitten or stung by nonvenomous insect and other nonvenomous arthropods, initial encounter: Secondary | ICD-10-CM

## 2024-04-29 MED ORDER — TRIAMCINOLONE ACETONIDE 0.1 % EX CREA: 1.0000 | TOPICAL_CREAM | Freq: Two times a day (BID) | CUTANEOUS | 0 refills | Status: AC

## 2024-04-29 NOTE — Progress Notes (Signed)
 Established Patient Office Visit  Subjective    Patient ID: Derek Fernandez, male    DOB: [birth year]  Age: 33 y.o. MRN: [id number]  CC:  Chief Complaint  Patient presents with   Medical Management of Chronic Issues    HPI KAIEL WEIDE presents for routine weight management. Patient has not been able to get med approved as he did not lose 5% of his weight in 6 months. He is presently not wanting to restart wegovy  or phentermine  2/2 SE. Patient also reports that he was in a hotel over the weekend and has bug bites on his arms that are itching but seem to be resolving.   Outpatient Encounter Medications as of 04/29/2024  Medication Sig   atorvastatin  (LIPITOR) 10 MG tablet Take 1 tablet (10 mg total) by mouth daily.   triamcinolone  cream (KENALOG ) 0.1 % Apply 1 Application topically 2 (two) times daily.   chlorpheniramine (CHLOR-TRIMETON) 4 MG tablet 1 tablet as needed (Patient not taking: Reported on 01/26/2024)   ciprofloxacin  (CIPRO ) 500 MG tablet Take 1 tablet (500 mg total) by mouth 2 (two) times daily. (Patient not taking: Reported on 01/26/2024)   ibuprofen (ADVIL) 800 MG tablet Take 800 mg by mouth 3 (three) times daily. (Patient not taking: Reported on 01/26/2024)   ondansetron  (ZOFRAN -ODT) 4 MG disintegrating tablet Take 1 tablet (4 mg total) by mouth every 8 (eight) hours as needed for nausea or vomiting. (Patient not taking: Reported on 01/26/2024)   STATUS COVID-19/FLU A&B KIT TEST AS DIRECTED TODAY (Patient not taking: Reported on 01/26/2024)   tirzepatide  (ZEPBOUND ) 10 MG/0.5ML Pen Inject 10 mg into the skin once a week. (Patient not taking: Reported on 04/29/2024)   tirzepatide  (ZEPBOUND ) 12.5 MG/0.5ML Pen Inject 12.5 mg into the skin once a week. (Patient not taking: Reported on 04/29/2024)   tirzepatide  7.5 MG/0.5ML injection vial Inject 7.5 mg into the skin once a week. (Patient not taking: Reported on 02/29/2024)   tirzepatide  7.5 MG/0.5ML injection vial Inject 7.5 mg into  the skin once a week. (Patient not taking: Reported on 02/29/2024)   No facility-administered encounter medications on file as of 04/29/2024.    Past Medical History:  Diagnosis Date   Personal history of asthma     Past Surgical History:  Procedure Laterality Date   TONSILLECTOMY      Family History  Problem Relation Age of Onset   Migraines Father        severe & give stroke like symptoms   Diabetes Paternal Grandmother    Heart disease Paternal Grandmother    Diabetes Paternal Grandfather    Heart disease Paternal Grandfather     Social History   Socioeconomic History   Marital status: Married    Spouse name: Not on file   Number of children: Not on file   Years of education: Not on file   Highest education level: Some college, no degree  Occupational History   Not on file  Tobacco Use   Smoking status: Never   Smokeless tobacco: Former    Types: Snuff   Tobacco comments:    Patient quit a year ago  Substance and Sexual Activity   Alcohol use: Yes    Comment: Occassional   Drug use: Never   Sexual activity: Yes  Other Topics Concern   Not on file  Social History Narrative   Not on file   Social Drivers of Health   Financial Resource Strain: Low Risk  (01/25/2024)   Overall  Financial Resource Strain (CARDIA)    Difficulty of Paying Living Expenses: Not hard at all  Food Insecurity: Food Insecurity Present (01/25/2024)   Hunger Vital Sign    Worried About Running Out of Food in the Last Year: Never true    Ran Out of Food in the Last Year: Sometimes true  Transportation Needs: No Transportation Needs (01/25/2024)   PRAPARE - Administrator, Civil Service (Medical): No    Lack of Transportation (Non-Medical): No  Physical Activity: Sufficiently Active (01/25/2024)   Exercise Vital Sign    Days of Exercise per Week: 4 days    Minutes of Exercise per Session: 60 min  Stress: No Stress Concern Present (01/25/2024)   [employer] of  Occupational Health - Occupational Stress Questionnaire    Feeling of Stress: Not at all  Social Connections: Socially Integrated (01/25/2024)   Social Connection and Isolation Panel    Frequency of Communication with Friends and Family: More than three times a week    Frequency of Social Gatherings with Friends and Family: Once a week    Attends Religious Services: More than 4 times per year    Active Member of [employer] or Organizations: Yes    Attends Banker Meetings: Never    Marital Status: Married  Catering manager Violence: Not on file    Review of Systems  Skin:  Positive for itching.  All other systems reviewed and are negative.       Objective    BP 131/82   Pulse 78   Ht 6' (1.829 m)   Wt (!) 300 lb 6.4 oz (136.3 kg)   SpO2 95%   BMI 40.74 kg/m   Physical Exam Vitals and nursing note reviewed.  Constitutional:      General: He is not in acute distress.    Appearance: He is obese.  Cardiovascular:     Rate and Rhythm: Normal rate and regular rhythm.  Pulmonary:     Effort: Pulmonary effort is normal.     Breath sounds: Normal breath sounds.  Abdominal:     Palpations: Abdomen is soft.     Tenderness: There is no abdominal tenderness.  Neurological:     General: No focal deficit present.     Mental Status: He is alert and oriented to person, place, and time.         Assessment & Plan:   Encounter for weight management  Class 3 severe obesity due to excess calories without serious comorbidity with body mass index (BMI) of 40.0 to 44.9 in adult  Bug bite, initial encounter  Other orders -     Triamcinolone  Acetonide; Apply 1 Application topically 2 (two) times daily.  Dispense: 80 g; Refill: 0   Patient to discuss with his insurance what his options may be.   Return if symptoms worsen or fail to improve.   Tanda Raguel SQUIBB, MD

## 2024-05-30 ENCOUNTER — Other Ambulatory Visit: Payer: Self-pay | Admitting: Family Medicine

## 2025-02-28 ENCOUNTER — Encounter: Payer: Self-pay | Admitting: Family Medicine
